# Patient Record
Sex: Female | Born: 2005 | Race: Black or African American | Hispanic: No | Marital: Single | Smoking: Never smoker
Health system: Southern US, Community
[De-identification: ages and names within clinical notes are randomized; demographics above are authoritative.]

## PROBLEM LIST (undated history)

## (undated) ENCOUNTER — Ambulatory Visit

## (undated) DIAGNOSIS — B338 Other specified viral diseases: Secondary | ICD-10-CM

## (undated) DIAGNOSIS — B974 Respiratory syncytial virus as the cause of diseases classified elsewhere: Secondary | ICD-10-CM

## (undated) DIAGNOSIS — E119 Type 2 diabetes mellitus without complications: Secondary | ICD-10-CM

## (undated) DIAGNOSIS — J45909 Unspecified asthma, uncomplicated: Secondary | ICD-10-CM

---
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
## 2023-01-28 ENCOUNTER — Ambulatory Visit
Admission: EM | Admit: 2023-01-28 | Discharge: 2023-01-28 | Disposition: A | Discharge status: Home / Self Care | Payer: Medicaid Other | Attending: Emergency Medicine | Admitting: Emergency Medicine

## 2023-01-28 ENCOUNTER — Encounter: Payer: Self-pay | Admitting: Emergency Medicine

## 2023-01-28 DIAGNOSIS — S80211A Abrasion, right knee, initial encounter: Secondary | ICD-10-CM

## 2023-01-28 DIAGNOSIS — S90811A Abrasion, right foot, initial encounter: Secondary | ICD-10-CM

## 2023-01-28 DIAGNOSIS — S80212A Abrasion, left knee, initial encounter: Secondary | ICD-10-CM

## 2023-01-28 DIAGNOSIS — S70211A Abrasion, right hip, initial encounter: Secondary | ICD-10-CM

## 2023-01-28 MED ORDER — MELOXICAM 7.5 MG PO TABS: 7.5000 mg | ORAL_TABLET | Freq: Every day | ORAL | 0 refills | Status: DC

## 2023-01-28 MED ORDER — MUPIROCIN CALCIUM 2 % EX CREA: 1.0000 | TOPICAL_CREAM | Freq: Two times a day (BID) | CUTANEOUS | 0 refills | Status: DC

## 2023-01-28 NOTE — ED Triage Notes (Signed)
Larey Seat today going down hill on concrete.  Multiple abrasion to right foot.  Large abrasion to right upper leg and abrasion to left knee.

## 2023-01-28 NOTE — Discharge Instructions (Signed)
You have been evaluated for your abrasions, areas have been cleaned by her nursing staff and dressings applied  Once home cleanse least once a day with diluted soap and water, use antibacterial soap such as Dial, pat and do not rub, apply topical antibiotic ointment and cover with a nonstick Band-Aid, may purchase large Band-Aids at most drug stores  Begin use of meloxicam once a day for at least 5 days then may use as needed, this medicine helps to reduce inflammation and will help manage her pain as you most likely will be sore, may take Tylenol 500 to 1000 mg every 6 hours in addition to this  Please watch for any signs of infection such as increased swelling, increased pain, new redness or puslike drainage if this occurs please return for evaluation

## 2023-01-28 NOTE — ED Provider Notes (Signed)
[facility]    CSN: [id number] Arrival date & time: 01/28/23  1809      History   Chief Complaint No chief complaint on file.   HPI Carla [name] is a 17 y.o. female.   Patient presents for evaluation of abrasions scattered across the bilateral lower extremities that occurred today after fall.  Patient was walking barefoot down a hill when she tripped and rolled down concrete.  Has not attempted treatment.  Able to bear weight and complete range of motion. denies numbness or tingling or swelling.   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) 02/12/2018   Type 2 diabetes mellitus with hyperglycemia, with long-term current use of insulin (HCC) 10/22/2016   Essential hypertension 10/22/2016   Inadequate parental supervision and control 09/23/2016   Noncompliance with diabetes treatment 09/23/2016   Maladaptive health behaviors affecting medical condition 07/16/2016   Hyperglycemia 05/04/2016   Abscess, gluteal cleft 05/21/2015   Folliculitis of perineum 05/21/2015   Acanthosis nigricans     Past Surgical History:  Procedure Laterality Date   INCISION AND DRAINAGE PERIRECTAL ABSCESS N/A 05/24/2015   Procedure: IRRIGATION AND DEBRIDEMENT PERIRECTAL ABSCESS PEDIATRIC;  Surgeon: Leonia Corona, MD;  Location: [facility] OR;  Service: Pediatrics;  Laterality: N/A;    OB History     Gravida  0    Para  0   Term  0   Preterm  0   AB  0   Living  0      SAB  0   IAB  0   Ectopic  0   Multiple  0   Live Births  0            Home Medications    Prior to Admission medications   Medication Sig Start Date End Date Taking? Authorizing Provider  meloxicam (MOBIC) 7.5 MG tablet Take 1 tablet (7.5 mg total) by mouth daily. 01/28/23  Yes Abagayle Klutts R, NP  mupirocin cream (BACTROBAN) 2 % Apply 1 Application topically 2 (two) times daily. 01/28/23  Yes Darshan Solanki R, NP  ACCU-CHEK FASTCLIX LANCETS MISC 1 each 4 (four) times daily - after meals and at bedtime by Does not apply route. Check sugar 6 x daily 07/08/17   Gretchen Short, NP  ACCU-CHEK GUIDE test strip USE ONE STRIP TO CHECK GLUCOSE SIX TIMES DAILY 02/08/18   Gretchen Short, NP  albuterol (PROVENTIL) (2.5 MG/3ML) 0.083% nebulizer solution Take 3 mLs (2.5 mg total) by nebulization every 6 (six) hours as needed for wheezing or shortness of breath. 08/01/22   Valentino Nose, NP  albuterol (VENTOLIN HFA) 108 (90 Base) MCG/ACT inhaler Inhale 1-2 puffs into the lungs every 6 (six) hours as needed for wheezing or shortness of breath. 08/01/22   Valentino Nose, NP  Blood Glucose Monitoring Suppl (ACCU-CHEK GUIDE) w/Device KIT 1 kit by Does not apply route daily as needed (use  to chcke Blood sugar 6x day). 12/02/18   Gretchen Short, NP  budesonide-formoterol (SYMBICORT) 160-4.5 MCG/ACT inhaler Inhale 2 puffs into the lungs 2 (two) times daily. Swish and spit after each use 11/04/22   Rodriguez-Southworth, Nettie Elm, PA-C  cetirizine (ZYRTEC ALLERGY) 10 MG tablet Take 1 tablet (10 mg total) by mouth daily. 06/27/21   Wallis Bamberg, PA-C  Dulaglutide (TRULICITY [place]) Inject into the skin.    [provider]  Norethindrone-Ethinyl Estradiol-Fe Biphas (LO LOESTRIN FE) 1 MG-10 MCG / 10 MCG tablet Take 1 tablet by mouth daily. Take 1 daily by mouth 09/04/22 12/03/22  Myna Hidalgo, DO    Family History Family History   Problem Relation Age of Onset   Diabetes Paternal Grandmother    Breast cancer Maternal Grandmother    Diabetes Maternal Grandfather     Social History Social History   Tobacco Use   Smoking status: Never   Smokeless tobacco: Never  Vaping Use   Vaping Use: Never used  Substance Use Topics   Alcohol use: Never   Drug use: Never     Allergies   Patient has no known allergies.   Review of Systems Review of Systems   Physical Exam Triage Vital Signs ED Triage Vitals  Enc Vitals Group     BP 01/28/23 1814 129/77     Pulse Rate 01/28/23 1814 (!) 102     Resp 01/28/23 1814 20     Temp 01/28/23 1814 98.7 F (37.1 C)     Temp Source 01/28/23 1814 Oral     SpO2 01/28/23 1814 97 %     Weight --      Height --      Head Circumference --      Peak Flow --      Pain Score 01/28/23 1815 5     Pain Loc --      Pain Edu? --      Excl. in GC? --    No data found.  Updated Vital Signs BP 129/77 (BP Location: Right Arm)   Pulse (!) 102   Temp 98.7 F (37.1 C) (Oral)   Resp 20   LMP 01/06/2023 (Exact Date)   SpO2 97%   Visual Acuity Right Eye Distance:   Left Eye Distance:   Bilateral Distance:    Right Eye Near:   Left Eye Near:    Bilateral Near:     Physical Exam Constitutional:      Appearance: Normal appearance.  Eyes:     Extraocular Movements: Extraocular movements intact.  Pulmonary:     Effort: Pulmonary effort is normal.  Skin:    Comments: 6 x 4 cm abrasion present to new lateral aspect of the right hip  4 x 3 cm abrasion present to the medial aspect of the right knee  1x1  cm abrasion present to anterior of the right knee  1.5 x 1.5 cm abrasion present to the anterior of the left knee  1 x 0.5 cm abrasion present to the dorsum aspect of the middle phalanx of the right fifth toe  1 x 0.5 cm cm abrasion present to the medial aspect of the proximal phalanx of the second toe without involvement of the nail  2 x 1 cm abrasion present to the  medial aspect of the right great toe  3 x 2.5 cm abrasion present to the medial aspect of the right ankle  1 x 1.5 cm abrasion present to the posterior aspect of the right  ankle    Neurological:     Mental Status: She is alert and oriented to person, place, and time. Mental status is at baseline.      UC Treatments / Results  Labs (all labs ordered are listed, but only abnormal results are displayed) Labs Reviewed - No data to display  EKG   Radiology No results found.  Procedures Procedures (including critical care time)  Medications Ordered in UC Medications - No data to display  Initial Impression / Assessment and Plan / UC Course  I have reviewed the triage vital signs and the nursing notes.  Pertinent labs & imaging results that were available during my care of the patient were reviewed by me and considered in my medical decision making (see chart for details).  Abrasions of the knee bilaterally, abrasion of the right foot, abrasion of the right hip  All areas have been cleaned by nursing staff with chlorhexidine and saline bacitracin applied and a nonadherent dressing, recommended daily cleansing with diluted antibacterial soap and water, application of bacitracin ointment and coverage with a nonadherent dressing until healed, prescribed meloxicam for management of pain, may take additional over-the-counter Tylenol as needed, given strict precautions for any signs of infection to return for reevaluation, may continue activity as tolerated Final Clinical Impressions(s) / UC Diagnoses   Final diagnoses:  Abrasion of knee, bilateral  Abrasion, foot, right, initial encounter  Abrasion, right hip, initial encounter     Discharge Instructions      You have been evaluated for your abrasions, areas have been cleaned by her nursing staff and dressings applied  Once home cleanse least once a day with diluted soap and water, use antibacterial soap such as Dial, pat and  do not rub, apply topical antibiotic ointment and cover with a nonstick Band-Aid, may purchase large Band-Aids at most drug stores  Begin use of meloxicam once a day for at least 5 days then may use as needed, this medicine helps to reduce inflammation and will help manage her pain as you most likely will be sore, may take Tylenol 500 to 1000 mg every 6 hours in addition to this  Please watch for any signs of infection such as increased swelling, increased pain, new redness or puslike drainage if this occurs please return for evaluation   ED Prescriptions     Medication Sig Dispense Auth. Provider   meloxicam (MOBIC) 7.5 MG tablet Take 1 tablet (7.5 mg total) by mouth daily. 30 tablet Neiman Roots R, NP   mupirocin cream (BACTROBAN) 2 % Apply 1 Application topically 2 (two) times daily. 15 g Valinda Hoar, NP      PDMP not reviewed this encounter.   Valinda Hoar, NP 01/28/23 1904

## 2023-02-10 ENCOUNTER — Ambulatory Visit
Admission: EM | Admit: 2023-02-10 | Discharge: 2023-02-10 | Disposition: A | Discharge status: Home / Self Care | Payer: Medicaid Other | Attending: Nurse Practitioner | Admitting: Nurse Practitioner

## 2023-02-10 DIAGNOSIS — L03115 Cellulitis of right lower limb: Secondary | ICD-10-CM

## 2023-02-10 MED ORDER — CEPHALEXIN 500 MG PO CAPS: 500.0000 mg | ORAL_CAPSULE | Freq: Four times a day (QID) | ORAL | 0 refills | Status: AC

## 2023-02-10 MED ORDER — MUPIROCIN CALCIUM 2 % EX CREA: 1.0000 | TOPICAL_CREAM | Freq: Two times a day (BID) | CUTANEOUS | 0 refills | Status: DC

## 2023-02-10 NOTE — ED Triage Notes (Signed)
Pt states she fell 2 weeks ago and has an abrasion on her right ankle that is not healing.  Seen here on the day it happened. Pt is diabetic. Open sore noted to right ankle, no drainage noted.

## 2023-02-10 NOTE — ED Provider Notes (Signed)
[facility]    CSN: [id number] Arrival date & time: 02/10/23  1322      History   Chief Complaint Chief Complaint  Patient presents with   Abrasion    HPI Carla [name] is a 17 y.o. female.   Patient presents today with concern for infection in abrasion to right medial ankle.  Reports that she was seen in urgent care approximately 2 weeks ago for multiple abrasions after a fall.  Reports all the other abrasions are healing well, however the one on her ankle is oozing pus and is painful to touch.  She denies redness, fever, nausea/vomiting.  She has been cleaning the wound and applying a thin layer of the mupirocin cream but has run out.  No numbness or tingling in the toes.  Patient does have a medical history significant for diabetes, high risk well-controlled; last A1c 2 months ago 5.9%.    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) 02/12/2018   Type 2 diabetes mellitus with hyperglycemia, with long-term current use of insulin (HCC) 10/22/2016   Essential hypertension 10/22/2016   Inadequate parental supervision and control 09/23/2016   Noncompliance with diabetes treatment 09/23/2016   Maladaptive health behaviors affecting medical condition 07/16/2016   Hyperglycemia 05/04/2016   Abscess, gluteal cleft 05/21/2015   Folliculitis of perineum 05/21/2015   Acanthosis nigricans      Past Surgical History:  Procedure Laterality Date   INCISION AND DRAINAGE PERIRECTAL ABSCESS N/A 05/24/2015   Procedure: IRRIGATION AND DEBRIDEMENT PERIRECTAL ABSCESS PEDIATRIC;  Surgeon: Leonia Corona, MD;  Location: [facility] OR;  Service: Pediatrics;  Laterality: N/A;    OB History     Gravida  0   Para  0   Term  0   Preterm  0   AB  0   Living  0      SAB  0   IAB  0   Ectopic  0   Multiple  0   Live Births  0            Home Medications    Prior to Admission medications   Medication Sig Start Date End Date Taking? Authorizing Provider  cephALEXin (KEFLEX) 500 MG capsule Take 1 capsule (500 mg total) by mouth 4 (four) times daily for 5 days. 02/10/23 02/15/23 Yes Valentino Nose, NP  ACCU-CHEK FASTCLIX LANCETS MISC 1 each 4 (four) times daily - after meals and at bedtime by Does not apply route. Check sugar 6 x daily 07/08/17   Gretchen Short, NP  ACCU-CHEK GUIDE test strip USE ONE STRIP TO CHECK GLUCOSE SIX TIMES DAILY 02/08/18   Gretchen Short, NP  albuterol (PROVENTIL) (2.5 MG/3ML) 0.083% nebulizer solution Take 3 mLs (2.5 mg total) by nebulization every 6 (six) hours as needed for wheezing or shortness of breath. 08/01/22   Valentino Nose, NP  albuterol (VENTOLIN HFA) 108 (90 Base) MCG/ACT  inhaler Inhale 1-2 puffs into the lungs every 6 (six) hours as needed for wheezing or shortness of breath. 08/01/22   Valentino Nose, NP  Blood Glucose Monitoring Suppl (ACCU-CHEK GUIDE) w/Device KIT 1 kit by Does not apply route daily as needed (use to chcke Blood sugar 6x day). 12/02/18   Gretchen Short, NP  budesonide-formoterol (SYMBICORT) 160-4.5 MCG/ACT inhaler Inhale 2 puffs into the lungs 2 (two) times daily. Swish and spit after each use 11/04/22   Rodriguez-Southworth, Nettie Elm, PA-C  cetirizine (ZYRTEC ALLERGY) 10 MG tablet Take 1 tablet (10 mg total) by mouth daily. 06/27/21   Wallis Bamberg, PA-C  Dulaglutide (TRULICITY [place]) Inject into the skin.     [provider]  meloxicam (MOBIC) 7.5 MG tablet Take 1 tablet (7.5 mg total) by mouth daily. 01/28/23   Valinda Hoar, NP  mupirocin cream (BACTROBAN) 2 % Apply 1 Application topically 2 (two) times daily. 02/10/23   Valentino Nose, NP  Norethindrone-Ethinyl Estradiol-Fe Biphas (LO LOESTRIN FE) 1 MG-10 MCG / 10 MCG tablet Take 1 tablet by mouth daily. Take 1 daily by mouth 09/04/22 12/03/22  Myna Hidalgo, DO    Family History Family History  Problem Relation Age of Onset   Diabetes Paternal Grandmother    Breast cancer Maternal Grandmother    Diabetes Maternal Grandfather     Social History Social History   Tobacco Use   Smoking status: Never   Smokeless tobacco: Never  Vaping Use   Vaping Use: Never used  Substance Use Topics   Alcohol use: Never   Drug use: Never     Allergies   Patient has no known allergies.   Review of Systems Review of Systems Per HPI  Physical Exam Triage Vital Signs ED Triage Vitals [02/10/23 1352]  Enc Vitals Group     BP 116/79     Pulse Rate 88     Resp 16     Temp 97.7 F (36.5 C)     Temp Source Oral     SpO2 99 %     Weight      Height      Head Circumference      Peak Flow      Pain Score 6     Pain Loc      Pain Edu?      Excl. in GC?    No data found.  Updated Vital Signs BP 116/79 (BP Location: Right Arm)   Pulse 88   Temp 97.7 F (36.5 C) (Oral)   Resp 16   LMP 02/01/2023 (Exact Date)   SpO2 99%   Visual Acuity Right Eye Distance:   Left Eye Distance:   Bilateral Distance:    Right Eye Near:   Left Eye Near:    Bilateral Near:     Physical Exam Vitals and nursing note reviewed.  Constitutional:      General: She is not in acute distress.    Appearance: Normal appearance. She is not toxic-appearing.  HENT:     Mouth/Throat:     Mouth: Mucous membranes are moist.     Pharynx: Oropharynx is clear.  Pulmonary:     Effort: Pulmonary effort is normal. No respiratory distress.  Skin:     General: Skin is warm and dry.     Capillary Refill: Capillary refill takes less than 2 seconds.     Findings: Abrasion present.     Comments: Circular wound to right medial ankle approximately 2 cm x  3 cm.  No fluctuance, warmth.  Minimal drainage with palpation of the wound.  Wound is tender to touch.  No surrounding erythema appreciated.  Neurological:     Mental Status: She is alert and oriented to person, place, and time.  Psychiatric:        Behavior: Behavior is cooperative.      UC Treatments / Results  Labs (all labs ordered are listed, but only abnormal results are displayed) Labs Reviewed - No data to display  EKG   Radiology No results found.  Procedures Procedures (including critical care time)  Medications Ordered in UC Medications - No data to display  Initial Impression / Assessment and Plan / UC Course  I have reviewed the triage vital signs and the nursing notes.  Pertinent labs & imaging results that were available during my care of the patient were reviewed by me and considered in my medical decision making (see chart for details).   Patient is well-appearing, normotensive, afebrile, not tachycardic, not tachypneic, oxygenating well on room air.    1. Cellulitis of right lower extremity Given history of diabetes, will cover for cellulitis with Keflex 4 times daily for 5 days Continue wound care mupirocin cream refill given Recommended follow-up with PCP for persistent/worsening symptoms despite treatment  The patient was given the opportunity to ask questions.  All questions answered to their satisfaction.  The patient is in agreement to this plan.    Final Clinical Impressions(s) / UC Diagnoses   Final diagnoses:  Cellulitis of right lower extremity     Discharge Instructions      Continue cleaning the skin twice daily with mild soap and water and pat dry Then apply a thin layer of the mupirocin cream twice daily  Start taking the Keflex and  take the entire course as prescribed  Follow up with PCP if symptoms persist or worsen despite treatment    ED Prescriptions     Medication Sig Dispense Auth. Provider   mupirocin cream (BACTROBAN) 2 % Apply 1 Application topically 2 (two) times daily. 15 g Cathlean Marseilles A, NP   cephALEXin (KEFLEX) 500 MG capsule Take 1 capsule (500 mg total) by mouth 4 (four) times daily for 5 days. 20 capsule Valentino Nose, NP      PDMP not reviewed this encounter.   Valentino Nose, NP 02/10/23 [phone]

## 2023-02-10 NOTE — Discharge Instructions (Addendum)
Continue cleaning the skin twice daily with mild soap and water and pat dry Then apply a thin layer of the mupirocin cream twice daily  Start taking the Keflex and take the entire course as prescribed  Follow up with PCP if symptoms persist or worsen despite treatment

## 2023-02-20 ENCOUNTER — Emergency Department (HOSPITAL_COMMUNITY)
Admission: EM | Admit: 2023-02-20 | Discharge: 2023-02-20 | Disposition: A | Discharge status: Home / Self Care | Payer: Medicaid Other | Attending: Emergency Medicine | Admitting: Emergency Medicine

## 2023-02-20 ENCOUNTER — Emergency Department (HOSPITAL_COMMUNITY): Payer: Medicaid Other

## 2023-02-20 ENCOUNTER — Other Ambulatory Visit: Payer: Self-pay

## 2023-02-20 ENCOUNTER — Encounter (HOSPITAL_COMMUNITY): Payer: Self-pay | Admitting: Emergency Medicine

## 2023-02-20 DIAGNOSIS — M25571 Pain in right ankle and joints of right foot: Secondary | ICD-10-CM | POA: Diagnosis present

## 2023-02-20 DIAGNOSIS — J45909 Unspecified asthma, uncomplicated: Secondary | ICD-10-CM | POA: Insufficient documentation

## 2023-02-20 DIAGNOSIS — E119 Type 2 diabetes mellitus without complications: Secondary | ICD-10-CM | POA: Insufficient documentation

## 2023-02-20 DIAGNOSIS — T1490XD Injury, unspecified, subsequent encounter: Secondary | ICD-10-CM

## 2023-02-20 MED ORDER — IBUPROFEN 800 MG PO TABS
800.0000 mg | ORAL_TABLET | Freq: Once | ORAL | Status: AC
  Administered 2023-02-20: 800 mg via ORAL
  Filled 2023-02-20: qty 1

## 2023-02-20 MED ORDER — OXYCODONE-ACETAMINOPHEN 5-325 MG PO TABS
1.0000 | ORAL_TABLET | Freq: Once | ORAL | Status: AC
  Administered 2023-02-20: 1 via ORAL
  Filled 2023-02-20: qty 1

## 2023-02-20 NOTE — Discharge Instructions (Signed)
You were seen today for ankle pain.  Your workup today is largely reassuring.  Your wound appears to be well-healing.  Your x-rays do not show any evidence of broken bones.  Take ibuprofen as needed for pain.  Ice as needed.  If you note warmth or redness of the ankle, fevers, or any new or worsening symptoms, you should be reevaluated.
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
## 2023-03-26 ENCOUNTER — Ambulatory Visit
Admission: EM | Admit: 2023-03-26 | Discharge: 2023-03-26 | Disposition: A | Discharge status: Home / Self Care | Payer: Medicaid Other | Attending: Family Medicine | Admitting: Family Medicine

## 2023-03-26 ENCOUNTER — Other Ambulatory Visit: Payer: Self-pay

## 2023-03-26 ENCOUNTER — Encounter: Payer: Self-pay | Admitting: Emergency Medicine

## 2023-03-26 ENCOUNTER — Ambulatory Visit (INDEPENDENT_AMBULATORY_CARE_PROVIDER_SITE_OTHER): Payer: Medicaid Other

## 2023-03-26 DIAGNOSIS — M79645 Pain in left finger(s): Secondary | ICD-10-CM

## 2023-03-26 DIAGNOSIS — S6992XA Unspecified injury of left wrist, hand and finger(s), initial encounter: Secondary | ICD-10-CM | POA: Diagnosis not present

## 2023-03-26 DIAGNOSIS — S61217A Laceration without foreign body of left little finger without damage to nail, initial encounter: Secondary | ICD-10-CM

## 2023-03-26 MED ORDER — MUPIROCIN 2 % EX OINT: 1.0000 | TOPICAL_OINTMENT | Freq: Two times a day (BID) | CUTANEOUS | 0 refills | Status: DC

## 2023-03-26 MED ORDER — CHLORHEXIDINE GLUCONATE 4 % EX SOLN: Freq: Every day | CUTANEOUS | 0 refills | Status: AC | PRN

## 2023-03-26 NOTE — Discharge Instructions (Signed)
Clean the area at least once a day with the Hibiclens solution and apply mupirocin ointment and a nonstick dressing.  Wear the finger splint for protection until feeling much better
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
## 2023-03-26 NOTE — ED Notes (Signed)
Site cleaned with hibiclense. Bacitracin applied, nonadherent pad, finger splint, secured with coban. Pt tolerated well.  Site management and infection prevention education reviewed. Pt verbalized understanding.

## 2023-05-06 ENCOUNTER — Emergency Department (HOSPITAL_COMMUNITY)
Admission: EM | Admit: 2023-05-06 | Discharge: 2023-05-06 | Disposition: A | Discharge status: Home / Self Care | Payer: Medicaid Other | Attending: Emergency Medicine | Admitting: Emergency Medicine

## 2023-05-06 ENCOUNTER — Other Ambulatory Visit: Payer: Self-pay

## 2023-05-06 ENCOUNTER — Emergency Department (HOSPITAL_COMMUNITY): Payer: Medicaid Other

## 2023-05-06 DIAGNOSIS — J45901 Unspecified asthma with (acute) exacerbation: Secondary | ICD-10-CM | POA: Diagnosis not present

## 2023-05-06 DIAGNOSIS — R0602 Shortness of breath: Secondary | ICD-10-CM | POA: Diagnosis present

## 2023-05-06 DIAGNOSIS — E119 Type 2 diabetes mellitus without complications: Secondary | ICD-10-CM | POA: Insufficient documentation

## 2023-05-06 MED ORDER — DEXAMETHASONE 4 MG PO TABS
10.0000 mg | ORAL_TABLET | Freq: Once | ORAL | Status: AC
  Administered 2023-05-06: 10 mg via ORAL
  Filled 2023-05-06: qty 3

## 2023-05-06 MED ORDER — IPRATROPIUM-ALBUTEROL 0.5-2.5 (3) MG/3ML IN SOLN
3.0000 mL | Freq: Once | RESPIRATORY_TRACT | Status: AC
  Administered 2023-05-06: 3 mL via RESPIRATORY_TRACT
  Filled 2023-05-06: qty 3

## 2023-05-06 NOTE — Discharge Instructions (Signed)
You were evaluated in the Emergency Department and after careful evaluation, we did not find any emergent condition requiring admission or further testing in the hospital.  Your exam/testing today is overall reassuring.  Symptoms likely due to a flare of your asthma.  Continue your home inhalers and follow-up with your regular doctor.  Please return to the Emergency Department if you experience any worsening of your condition.   Thank you for allowing [place] to be a part of your care.

## 2023-05-06 NOTE — ED Provider Notes (Signed)
AP-EMERGENCY DEPT [facility] Emergency Department Provider Note MRN:  [id number]  Arrival date & time: 05/06/23     Chief Complaint   Shortness of Breath   History of Present Illness   Carla [name] is a 17 y.o. year-old female with a history of diabetes, asthma presenting to the ED with chief complaint of shortness of breath.  Feeling short of breath and chest tightness, feels similar to prior flares of her asthma.  Started today at work.  Thinks maybe it is due to the change in the weather.  Denies leg pain or swelling.  Review of Systems  A thorough review of systems was obtained and all systems are negative except as noted in the HPI and PMH.   Patient's Health History    Past Medical History:  Diagnosis Date   Asthma    Diabetes (HCC)    Diabetes mellitus without complication (HCC)    RSV infection     Past Surgical History:  Procedure Laterality Date   INCISION AND DRAINAGE PERIRECTAL ABSCESS N/A 05/24/2015   Procedure: IRRIGATION AND DEBRIDEMENT PERIRECTAL ABSCESS PEDIATRIC;  Surgeon: Leonia Corona, MD;  Location: [facility] OR;  Service: Pediatrics;  Laterality: N/A;    Family History  Problem Relation Age of Onset   Diabetes Paternal Grandmother    Breast cancer Maternal Grandmother    Diabetes Maternal Grandfather     Social History   Socioeconomic History   Marital status: Single    Spouse name: Not on file   Number of children: Not on file   Years of education: Not on file   Highest education level: Not on file  Occupational History   Not on file  Tobacco Use   Smoking status: Never   Smokeless tobacco: Never  Vaping Use   Vaping status: Never Used  Substance and Sexual Activity   Alcohol use: Never   Drug use: Never   Sexual activity: Yes    Birth control/protection: Condom  Other Topics Concern   Not on file  Social History Narrative   She is currently in 11th grade at [employer]. She lives with mother and stepdad. She enjoys playing on  her phone and sing.    Social Determinants of Health   Financial Resource Strain: Low Risk  (09/04/2022)   Overall Financial Resource Strain (CARDIA)    Difficulty of Paying Living Expenses: Not very hard  Food Insecurity: Food Insecurity Present (09/04/2022)   Hunger Vital Sign    Worried About Running Out of Food in the Last Year: Sometimes true    Ran Out of Food in the Last Year: Sometimes true  Transportation Needs: No Transportation Needs (09/04/2022)   PRAPARE - Administrator, Civil Service (Medical): No    Lack of Transportation (Non-Medical): No  Physical Activity: Insufficiently Active (09/04/2022)   Exercise Vital Sign    Days of Exercise per Week: 2 days    Minutes of Exercise per Session: 30 min  Stress: Stress Concern Present (09/04/2022)   [employer] of Occupational Health - Occupational Stress Questionnaire    Feeling of Stress : Rather much  Social Connections: Moderately Integrated (09/04/2022)   Social Connection and Isolation Panel [NHANES]    Frequency of Communication with Friends and Family: Three times a week    Frequency of Social Gatherings with Friends and Family: Three times a week    Attends Religious Services: More than 4 times per year    Active Member of Clubs or Organizations: Yes  Attends Banker Meetings: More than 4 times per year    Marital Status: Never married  Intimate Partner Violence: Unknown (09/04/2022)   Humiliation, Afraid, Rape, and Kick questionnaire    Fear of Current or Ex-Partner: No    Emotionally Abused: No    Physically Abused: No    Sexually Abused: Patient declined     Physical Exam   Vitals:   05/06/23 0129 05/06/23 0209  BP:    Pulse: 99   Resp:    Temp:    SpO2: 93% 96%    CONSTITUTIONAL: Well-appearing, NAD NEURO/PSYCH:  Alert and oriented x 3, no focal deficits EYES:  eyes equal and reactive ENT/NECK:  no LAD, no JVD CARDIO: Regular rate, well-perfused, normal S1 and S2 PULM:  Diffuse wheezing GI/GU:  non-distended, non-tender MSK/SPINE:  No gross deformities, no edema SKIN:  no rash, atraumatic   *Additional and/or pertinent findings included in MDM below  Diagnostic and Interventional Summary    EKG Interpretation Date/Time:  Wednesday May 06 2023 01:56:18 EDT Ventricular Rate:  104 PR Interval:  129 QRS Duration:  75 QT Interval:  320 QTC Calculation: 421 R Axis:   68  Text Interpretation: Sinus tachycardia Confirmed by Kennis Carina [phone]) on 05/06/2023 2:36:54 AM       Labs Reviewed - No data to display  DG Chest Port 1 View  Final Result      Medications  dexamethasone (DECADRON) tablet 10 mg (has no administration in time range)  ipratropium-albuterol (DUONEB) 0.5-2.5 (3) MG/3ML nebulizer solution 3 mL (3 mLs Nebulization Given 05/06/23 0209)     Procedures  /  Critical Care Procedures  ED Course and Medical Decision Making  Initial Impression and Ddx Wheezing noted on exam, history of asthma, overall favoring asthma exacerbation.  Minimal tachycardia.  Overall highly doubt PE.  Will reassess after breathing treatment, obtain screening EKG, chest x-ray.  Past medical/surgical history that increases complexity of ED encounter: Asthma  Interpretation of Diagnostics I personally reviewed the EKG and my interpretation is as follows: Sinus rhythm  Chest x-ray without pneumothorax or lobar opacity  Patient Reassessment and Ultimate Disposition/Management     Lungs much improved and patient feeling better on reassessment after DuoNeb, appropriate for discharge.  Patient management required discussion with the following services or consulting groups:  None  Complexity of Problems Addressed Acute illness or injury that poses threat of life of bodily function  Additional Data Reviewed and Analyzed Further history obtained from: None  Additional Factors Impacting ED Encounter Risk None  Elmer Sow. Pilar Plate, MD [facility]  Emergency Medicine [facility] [email or web address]  Final Clinical Impressions(s) / ED Diagnoses     ICD-10-CM   1. Exacerbation of asthma, unspecified asthma severity, unspecified whether persistent  J45.901       ED Discharge Orders     None        Discharge Instructions Discussed with and Provided to Patient:    Discharge Instructions      You were evaluated in the Emergency Department and after careful evaluation, we did not find any emergent condition requiring admission or further testing in the hospital.  Your exam/testing today is overall reassuring.  Symptoms likely due to a flare of your asthma.  Continue your home inhalers and follow-up with your regular doctor.  Please return to the Emergency Department if you experience any worsening of your condition.   Thank you for allowing [place] to be a part of your care.  Sabas Sous, MD 05/06/23 [phone]

## 2023-05-06 NOTE — ED Triage Notes (Signed)
Pt c/o SOB, states she has asthma and began having an asthma attack at work. Has not been able to use her inhaler

## 2023-07-01 ENCOUNTER — Ambulatory Visit
Admission: EM | Admit: 2023-07-01 | Discharge: 2023-07-01 | Disposition: A | Discharge status: Home / Self Care | Payer: Medicaid Other | Attending: Family Medicine | Admitting: Family Medicine

## 2023-07-01 ENCOUNTER — Encounter: Payer: Self-pay | Admitting: Emergency Medicine

## 2023-07-01 DIAGNOSIS — J3089 Other allergic rhinitis: Secondary | ICD-10-CM

## 2023-07-01 DIAGNOSIS — J4541 Moderate persistent asthma with (acute) exacerbation: Secondary | ICD-10-CM

## 2023-07-01 MED ORDER — PREDNISONE 20 MG PO TABS: 40.0000 mg | ORAL_TABLET | Freq: Every day | ORAL | 0 refills | Status: DC

## 2023-07-01 MED ORDER — DEXAMETHASONE SODIUM PHOSPHATE 10 MG/ML IJ SOLN
10.0000 mg | Freq: Once | INTRAMUSCULAR | Status: AC
  Administered 2023-07-01: 10 mg via INTRAMUSCULAR

## 2023-07-01 MED ORDER — PROMETHAZINE-DM 6.25-15 MG/5ML PO SYRP: 5.0000 mL | ORAL_SOLUTION | Freq: Four times a day (QID) | ORAL | 0 refills | Status: DC | PRN

## 2023-07-01 NOTE — ED Triage Notes (Signed)
Feels wheezing in chest x 3 days.  States she had a productive cough.

## 2023-07-01 NOTE — ED Provider Notes (Signed)
[facility]    CSN: [id number] Arrival date & time: 07/01/23  1027      History   Chief Complaint No chief complaint on file.   HPI Carla [name] is a 17 y.o. female.   Presenting today with 3-day history of congestion, postnasal drainage, sore throat, wheezing, chest tightness, hacking cough.  Denies fever, chills, body aches, chest pain, abdominal pain, nausea vomiting or diarrhea.  History of persistent asthma on Symbicort, albuterol as needed.  Last use of albuterol was yesterday evening.  States compliant with allergy regimen.    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) 02/12/2018   Type 2 diabetes mellitus with hyperglycemia, with long-term current use of insulin (HCC) 10/22/2016   Essential hypertension 10/22/2016   Inadequate parental supervision and control 09/23/2016   Noncompliance with diabetes treatment 09/23/2016   Maladaptive health behaviors affecting medical condition 07/16/2016   Hyperglycemia 05/04/2016   Abscess, gluteal cleft 05/21/2015   Folliculitis of perineum 05/21/2015   Acanthosis nigricans     Past Surgical History:  Procedure Laterality Date   INCISION AND DRAINAGE PERIRECTAL ABSCESS N/A 05/24/2015   Procedure: IRRIGATION AND DEBRIDEMENT PERIRECTAL ABSCESS PEDIATRIC;  Surgeon: Leonia Corona, MD;  Location: [facility] OR;  Service: Pediatrics;  Laterality:  N/A;    OB History     Gravida  0   Para  0   Term  0   Preterm  0   AB  0   Living  0      SAB  0   IAB  0   Ectopic  0   Multiple  0   Live Births  0            Home Medications    Prior to Admission medications   Medication Sig Start Date End Date Taking? Authorizing Provider  predniSONE (DELTASONE) 20 MG tablet Take 2 tablets (40 mg total) by mouth daily with breakfast. 07/01/23  Yes Particia Nearing, PA-C  promethazine-dextromethorphan (PROMETHAZINE-DM) 6.25-15 MG/5ML syrup Take 5 mLs by mouth 4 (four) times daily as needed. 07/01/23  Yes Particia Nearing, PA-C  ACCU-CHEK FASTCLIX LANCETS MISC 1 each 4 (four) times daily - after meals and at bedtime by Does not apply route. Check sugar 6 x daily 07/08/17   Gretchen Short, NP  ACCU-CHEK GUIDE test strip USE ONE STRIP TO CHECK GLUCOSE SIX TIMES DAILY 02/08/18   Gretchen Short, NP  albuterol (PROVENTIL) (2.5 MG/3ML) 0.083% nebulizer solution Take 3 mLs (2.5 mg total) by nebulization every 6 (six) hours as needed for wheezing or shortness of breath. 08/01/22   Valentino Nose, NP  albuterol (VENTOLIN HFA) 108 (90 Base) MCG/ACT inhaler Inhale 1-2 puffs into the lungs every 6 (six) hours as needed for wheezing or shortness of breath. 08/01/22   Valentino Nose, NP  Blood Glucose Monitoring Suppl (ACCU-CHEK GUIDE) w/Device KIT 1 kit by Does  not apply route daily as needed (use to chcke Blood sugar 6x day). 12/02/18   Gretchen Short, NP  budesonide-formoterol (SYMBICORT) 160-4.5 MCG/ACT inhaler Inhale 2 puffs into the lungs 2 (two) times daily. Swish and spit after each use 11/04/22   Rodriguez-Southworth, Nettie Elm, PA-C  cetirizine (ZYRTEC ALLERGY) 10 MG tablet Take 1 tablet (10 mg total) by mouth daily. 06/27/21   Wallis Bamberg, PA-C  chlorhexidine (HIBICLENS) 4 % external liquid Apply topically daily as needed. 03/26/23   Particia Nearing, PA-C  Dulaglutide (TRULICITY [place]) Inject into the skin.     [provider]  Norethindrone-Ethinyl Estradiol-Fe Biphas (LO LOESTRIN FE) 1 MG-10 MCG / 10 MCG tablet Take 1 tablet by mouth daily. Take 1 daily by mouth 09/04/22 12/03/22  Myna Hidalgo, DO    Family History Family History  Problem Relation Age of Onset   Diabetes Paternal Grandmother    Breast cancer Maternal Grandmother    Diabetes Maternal Grandfather     Social History Social History   Tobacco Use   Smoking status: Never   Smokeless tobacco: Never  Vaping Use   Vaping status: Never Used  Substance Use Topics   Alcohol use: Never   Drug use: Never     Allergies   Patient has no known allergies.   Review of Systems Review of Systems PER HPI  Physical Exam Triage Vital Signs ED Triage Vitals  Encounter Vitals Group     BP 07/01/23 1048 126/84     Systolic BP Percentile --      Diastolic BP Percentile --      Pulse Rate 07/01/23 1048 90     Resp 07/01/23 1048 18     Temp 07/01/23 1048 98 F (36.7 C)     Temp Source 07/01/23 1048 Oral     SpO2 07/01/23 1048 98 %     Weight --      Height --      Head Circumference --      Peak Flow --      Pain Score 07/01/23 1049 5     Pain Loc --      Pain Education --      Exclude from Growth Chart --    No data found.  Updated Vital Signs BP 126/84 (BP Location: Right Arm)   Pulse 90   Temp 98 F (36.7 C) (Oral)   Resp 18   LMP 06/16/2023 (Exact Date)   SpO2 98%   Visual Acuity Right Eye Distance:   Left Eye Distance:   Bilateral Distance:    Right Eye Near:   Left Eye Near:    Bilateral Near:     Physical Exam Vitals and nursing note reviewed.  Constitutional:      Appearance: Normal appearance.  HENT:     Head: Atraumatic.     Right Ear: Tympanic membrane and external ear normal.     Left Ear: Tympanic membrane and external ear normal.     Nose: Congestion present.     Mouth/Throat:     Mouth: Mucous membranes are moist.     Pharynx: Posterior oropharyngeal erythema present.   Eyes:     Extraocular Movements: Extraocular movements intact.     Conjunctiva/sclera: Conjunctivae normal.  Cardiovascular:     Rate and Rhythm: Normal rate and regular rhythm.     Heart sounds: Normal heart sounds.  Pulmonary:     Effort: Pulmonary effort is normal.     Breath sounds: Wheezing present. No  rales.  Musculoskeletal:        General: Normal range of motion.     Cervical back: Normal range of motion and neck supple.  Skin:    General: Skin is warm and dry.  Neurological:     Mental Status: She is alert and oriented to person, place, and time.  Psychiatric:        Mood and Affect: Mood normal.        Thought Content: Thought content normal.      UC Treatments / Results  Labs (all labs ordered are listed, but only abnormal results are displayed) Labs Reviewed - No data to display  EKG   Radiology No results found.  Procedures Procedures (including critical care time)  Medications Ordered in UC Medications  dexamethasone (DECADRON) injection 10 mg (10 mg Intramuscular Given 07/01/23 1154)    Initial Impression / Assessment and Plan / UC Course  I have reviewed the triage vital signs and the nursing notes.  Pertinent labs & imaging results that were available during my care of the patient were reviewed by me and considered in my medical decision making (see chart for details).     Suspect seasonal allergy exacerbation causing asthma exacerbation. Vital signs within normal limits and she appears well and in no acute distress.  Treat with IM Decadron, Phenergan DM, allergy and asthma regimen and p.o. prednisone sent in case not resolving with the IM Decadron.  Final Clinical Impressions(s) / UC Diagnoses   Final diagnoses:  Seasonal allergic rhinitis due to other allergic trigger  Moderate persistent asthma with acute exacerbation   Discharge Instructions   None    ED Prescriptions     Medication Sig Dispense Auth. Provider   predniSONE  (DELTASONE) 20 MG tablet Take 2 tablets (40 mg total) by mouth daily with breakfast. 10 tablet Particia Nearing, PA-C   promethazine-dextromethorphan (PROMETHAZINE-DM) 6.25-15 MG/5ML syrup Take 5 mLs by mouth 4 (four) times daily as needed. 100 mL Particia Nearing, [place]      PDMP not reviewed this encounter.   Particia Nearing, [place] 07/01/23 1158

## 2023-08-15 ENCOUNTER — Ambulatory Visit
Admission: EM | Admit: 2023-08-15 | Discharge: 2023-08-15 | Disposition: A | Discharge status: Home / Self Care | Payer: Medicaid Other | Attending: Nurse Practitioner | Admitting: Nurse Practitioner

## 2023-08-15 ENCOUNTER — Ambulatory Visit (INDEPENDENT_AMBULATORY_CARE_PROVIDER_SITE_OTHER): Payer: Medicaid Other

## 2023-08-15 DIAGNOSIS — R112 Nausea with vomiting, unspecified: Secondary | ICD-10-CM

## 2023-08-15 DIAGNOSIS — Z8639 Personal history of other endocrine, nutritional and metabolic disease: Secondary | ICD-10-CM

## 2023-08-15 DIAGNOSIS — T887XXA Unspecified adverse effect of drug or medicament, initial encounter: Secondary | ICD-10-CM | POA: Diagnosis not present

## 2023-08-15 DIAGNOSIS — R109 Unspecified abdominal pain: Secondary | ICD-10-CM

## 2023-08-15 LAB — POCT URINALYSIS DIP (MANUAL ENTRY)
Bilirubin, UA: NEGATIVE
Blood, UA: NEGATIVE
Glucose, UA: NEGATIVE mg/dL
Ketones, POC UA: NEGATIVE mg/dL
Leukocytes, UA: NEGATIVE
Nitrite, UA: NEGATIVE
Protein Ur, POC: NEGATIVE mg/dL
Spec Grav, UA: 1.02 (ref 1.010–1.025)
Urobilinogen, UA: 1 U/dL
pH, UA: 8.5 — AB (ref 5.0–8.0)

## 2023-08-15 LAB — POCT URINE PREGNANCY: Preg Test, Ur: NEGATIVE

## 2023-08-15 LAB — POCT FASTING CBG KUC MANUAL ENTRY: POCT Glucose (KUC): 114 mg/dL — AB (ref 70–99)

## 2023-08-15 MED ORDER — LIDOCAINE VISCOUS HCL 2 % MT SOLN
15.0000 mL | Freq: Once | OROMUCOSAL | Status: AC
  Administered 2023-08-15: 15 mL via OROMUCOSAL

## 2023-08-15 MED ORDER — MYLANTA MAXIMUM STRENGTH 400-400-40 MG/5ML PO SUSP: 15.0000 mL | Freq: Four times a day (QID) | ORAL | 0 refills | Status: DC | PRN

## 2023-08-15 MED ORDER — FAMOTIDINE 20 MG PO TABS: 20.0000 mg | ORAL_TABLET | Freq: Every day | ORAL | 0 refills | Status: DC

## 2023-08-15 MED ORDER — ALUM & MAG HYDROXIDE-SIMETH 200-200-20 MG/5ML PO SUSP
30.0000 mL | Freq: Once | ORAL | Status: AC
  Administered 2023-08-15: 30 mL via ORAL

## 2023-08-15 MED ORDER — ONDANSETRON 4 MG PO TBDP: 4.0000 mg | ORAL_TABLET | Freq: Three times a day (TID) | ORAL | 0 refills | Status: AC | PRN

## 2023-08-15 NOTE — Discharge Instructions (Addendum)
X-ray of the abdomen is normal.  Urinalysis was also negative for possible infection. Take medication as prescribed. Increase fluids and allow for plenty of rest. May take over-the-counter Tylenol as needed for pain, fever, or general discomfort. Recommend a bland diet until nausea and vomiting improved.  This includes chicken noodle soup, broth, Jell-O, popsicles, Sprite or ginger ale. Continue to monitor your blood glucose levels closely.  If they exceed 450, it is recommended that you go to the emergency department for further evaluation. If symptoms continue to persist, there is concern that the Trulicity may be causing your symptoms.  I would speak to your endocrinologist/PCP if so. Go to the emergency department immediately for worsening abdominal pain, nausea or vomiting that you cannot control, or new symptoms of fever, chills, or other concerns. Follow-up as needed.

## 2023-08-15 NOTE — ED Triage Notes (Signed)
Pt reports she has been nauseas and having vomiting mid abdominal pain x 2 days.   States he sugars have been running 250s plus. States they are usually within normal range.

## 2023-08-15 NOTE — ED Provider Notes (Signed)
[facility]    CSN: [id number] Arrival date & time: 08/15/23  1358      History   Chief Complaint No chief complaint on file.   HPI Carla [name] is a 17 y.o. female.   The history is provided by the patient.   Patient presents for complaints of a 2-day history of abdominal pain with nausea and vomiting.  Patient states her pain is located around her bellybutton and right above her bellybutton, states that she becomes nauseated when she tries to eat something.  She rates her pain 5/10 at present.  Patient reports that her last bowel movement was today, but that she did strain when she went, and does not have a bowel movement daily.  She also reports that her blood sugars have been around 250, which is out of the norm for her.  She currently takes Trulicity as she is a type II diabetic.  States she was diagnosed at the age of 66.  Patient states that she has been experiencing "sour burps", along with bloating and gas.  Patient states that she missed 1 week of her Trulicity and did not have any symptoms.  Patient denies fever, chills, diarrhea, bloody stools, or urinary symptoms.  Reports that she did take 1 unit of NovoLog for her elevated blood sugars.  LMP: 08/10/2023.  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) 02/12/2018   Type 2 diabetes mellitus with hyperglycemia, with long-term current  use of insulin (HCC) 10/22/2016   Essential hypertension 10/22/2016   Inadequate parental supervision and control 09/23/2016   Noncompliance with diabetes treatment 09/23/2016   Maladaptive health behaviors affecting medical condition 07/16/2016   Hyperglycemia 05/04/2016   Abscess, gluteal cleft 05/21/2015   Folliculitis of perineum 05/21/2015   Acanthosis nigricans     Past Surgical History:  Procedure Laterality Date   INCISION AND DRAINAGE PERIRECTAL ABSCESS N/A 05/24/2015   Procedure: IRRIGATION AND DEBRIDEMENT PERIRECTAL ABSCESS PEDIATRIC;  Surgeon: Leonia Corona, MD;  Location: [facility] OR;  Service: Pediatrics;  Laterality: N/A;    OB History     Gravida  0   Para  0   Term  0   Preterm  0   AB  0   Living  0      SAB  0   IAB  0   Ectopic  0   Multiple  0   Live Births  0            Home Medications    Prior to Admission medications   Medication Sig Start Date End Date Taking? Authorizing Provider  alum & mag hydroxide-simeth (MYLANTA MAXIMUM STRENGTH) 400-400-40 MG/5ML suspension Take 15 mLs by mouth every 6 (six) hours as needed for indigestion. 08/15/23  Yes Leath-Warren, Sadie Haber, NP  famotidine (PEPCID) 20 MG tablet Take 1 tablet (20 mg total) by mouth daily. 08/15/23  Yes Leath-Warren, Sadie Haber,  NP  ondansetron (ZOFRAN-ODT) 4 MG disintegrating tablet Take 1 tablet (4 mg total) by mouth every 8 (eight) hours as needed. 08/15/23  Yes Leath-Warren, Sadie Haber, NP  ACCU-CHEK FASTCLIX LANCETS MISC 1 each 4 (four) times daily - after meals and at bedtime by Does not apply route. Check sugar 6 x daily 07/08/17   Gretchen Short, NP  ACCU-CHEK GUIDE test strip USE ONE STRIP TO CHECK GLUCOSE SIX TIMES DAILY 02/08/18   Gretchen Short, NP  albuterol (PROVENTIL) (2.5 MG/3ML) 0.083% nebulizer solution Take 3 mLs (2.5 mg total) by nebulization every 6 (six) hours as needed for wheezing or shortness of breath. 08/01/22   Valentino Nose, NP  albuterol  (VENTOLIN HFA) 108 (90 Base) MCG/ACT inhaler Inhale 1-2 puffs into the lungs every 6 (six) hours as needed for wheezing or shortness of breath. 08/01/22   Valentino Nose, NP  Blood Glucose Monitoring Suppl (ACCU-CHEK GUIDE) w/Device KIT 1 kit by Does not apply route daily as needed (use to chcke Blood sugar 6x day). 12/02/18   Gretchen Short, NP  budesonide-formoterol (SYMBICORT) 160-4.5 MCG/ACT inhaler Inhale 2 puffs into the lungs 2 (two) times daily. Swish and spit after each use 11/04/22   Rodriguez-Southworth, Nettie Elm, PA-C  cetirizine (ZYRTEC ALLERGY) 10 MG tablet Take 1 tablet (10 mg total) by mouth daily. 06/27/21   Wallis Bamberg, PA-C  chlorhexidine (HIBICLENS) 4 % external liquid Apply topically daily as needed. 03/26/23   Particia Nearing, PA-C  Dulaglutide (TRULICITY [place]) Inject into the skin.    [provider]  Norethindrone-Ethinyl Estradiol-Fe Biphas (LO LOESTRIN FE) 1 MG-10 MCG / 10 MCG tablet Take 1 tablet by mouth daily. Take 1 daily by mouth 09/04/22 12/03/22  Myna Hidalgo, DO  predniSONE (DELTASONE) 20 MG tablet Take 2 tablets (40 mg total) by mouth daily with breakfast. 07/01/23   Particia Nearing, PA-C  promethazine-dextromethorphan (PROMETHAZINE-DM) 6.25-15 MG/5ML syrup Take 5 mLs by mouth 4 (four) times daily as needed. 07/01/23   Particia Nearing, PA-C    Family History Family History  Problem Relation Age of Onset   Diabetes Paternal Grandmother    Breast cancer Maternal Grandmother    Diabetes Maternal Grandfather     Social History Social History   Tobacco Use   Smoking status: Never   Smokeless tobacco: Never  Vaping Use   Vaping status: Never Used  Substance Use Topics   Alcohol use: Never   Drug use: Never     Allergies   Patient has no known allergies.   Review of Systems Review of Systems Per HPI  Physical Exam Triage Vital Signs ED Triage Vitals  Encounter Vitals Group     BP 08/15/23 1402 131/82     Systolic BP  Percentile --      Diastolic BP Percentile --      Pulse Rate 08/15/23 1402 85     Resp 08/15/23 1402 16     Temp 08/15/23 1402 98 F (36.7 C)     Temp Source 08/15/23 1402 Oral     SpO2 08/15/23 1402 98 %     Weight --      Height --      Head Circumference --      Peak Flow --      Pain Score 08/15/23 1403 5     Pain Loc --      Pain Education --      Exclude from Growth Chart --    No data found.  Updated Vital Signs BP 131/82 (BP Location: Right Arm)   Pulse 85   Temp 98 F (36.7 C) (Oral)   Resp 16   LMP 08/10/2023 Comment: end date  SpO2 98%   Visual Acuity Right Eye Distance:   Left Eye Distance:   Bilateral Distance:    Right Eye Near:   Left Eye Near:    Bilateral Near:     Physical Exam Vitals and nursing note reviewed.  Constitutional:      General: She is not in acute distress.    Appearance: Normal appearance.  HENT:     Head: Normocephalic.  Eyes:     Extraocular Movements: Extraocular movements intact.     Conjunctiva/sclera: Conjunctivae normal.     Pupils: Pupils are equal, round, and reactive to light.  Cardiovascular:     Rate and Rhythm: Normal rate and regular rhythm.     Pulses: Normal pulses.     Heart sounds: Normal heart sounds.  Pulmonary:     Effort: Pulmonary effort is normal. No respiratory distress.     Breath sounds: Normal breath sounds. No stridor. No wheezing, rhonchi or rales.  Abdominal:     General: Bowel sounds are normal.     Palpations: Abdomen is soft.     Tenderness: There is abdominal tenderness in the epigastric area and periumbilical area.  Musculoskeletal:     Cervical back: Normal range of motion.  Lymphadenopathy:     Cervical: No cervical adenopathy.  Skin:    General: Skin is warm and dry.  Neurological:     General: No focal deficit present.     Mental Status: She is alert and oriented to person, place, and time.  Psychiatric:        Mood and Affect: Mood normal.        Behavior: Behavior normal.       UC Treatments / Results  Labs (all labs ordered are listed, but only abnormal results are displayed) Labs Reviewed  POCT URINALYSIS DIP (MANUAL ENTRY) - Abnormal; Notable for the following components:      Result Value   pH, UA 8.5 (*)    All other components within normal limits  POCT FASTING CBG [facility] MANUAL ENTRY - Abnormal; Notable for the following components:   POCT Glucose ([facility]) 114 (*)    All other components within normal limits  POCT URINE PREGNANCY    EKG   Radiology DG Abd 2 Views Result Date: 08/15/2023 CLINICAL DATA:  Abdominal pain for 2 days. EXAM: ABDOMEN - 2 VIEW COMPARISON:  Abdominal radiograph dated 09/15/2007. FINDINGS: The bowel gas pattern is normal. There is no evidence of free air. No radio-opaque calculi or other significant radiographic abnormality is seen. IMPRESSION: Negative. Electronically Signed   By: Romona Curls M.D.   On: 08/15/2023 15:04    Procedures Procedures (including critical care time)  Medications Ordered in UC Medications  alum & mag hydroxide-simeth (MAALOX/MYLANTA) 200-200-20 MG/5ML suspension 30 mL (30 mLs Oral Given 08/15/23 1455)  lidocaine (XYLOCAINE) 2 % viscous mouth solution 15 mL (15 mLs Mouth/Throat Given 08/15/23 1455)    Initial Impression / Assessment and Plan / UC Course  I have reviewed the triage vital signs and the nursing notes.  Pertinent labs & imaging results that were available during my care of the patient were reviewed by me and considered in my medical decision making (see chart for details).  X-ray of the abdomen was negative, urinalysis was also negative for faction.  Patient  did find good improvement of symptoms with GI cocktail.  Suspect patient symptoms may be caused by her Trulicity.  She reports that she did not have any symptoms after missing 1 dose of her Trulicity.  Will provide symptomatic treatment with ondansetron 4 mg for nausea, famotidine 20 mg for gas, bloating, and nausea, and  Mylanta for indigestion.  Supportive care recommendations were provided and discussed with the patient to include over-the-counter analgesics, dietary modifications to increase fruits and vegetables, a bland diet until nausea and vomiting improved, and to monitor blood glucose levels closely.  Patient was advised to follow-up with her endocrinologist to discuss current symptoms.  Patient was also given strict ER follow-up precautions.  Patient was in agreement with this plan of care and verbalized understanding.  All questions were answered.  Patient stable for discharge.  Work note was provided.  Final Clinical Impressions(s) / UC Diagnoses   Final diagnoses:  Abdominal pain, unspecified abdominal location  Nausea and vomiting, unspecified vomiting type  History of diabetes mellitus  Medication side effects     Discharge Instructions      X-ray of the abdomen is normal.  Urinalysis was also negative for possible infection. Take medication as prescribed. Increase fluids and allow for plenty of rest. May take over-the-counter Tylenol as needed for pain, fever, or general discomfort. Recommend a bland diet until nausea and vomiting improved.  This includes chicken noodle soup, broth, Jell-O, popsicles, Sprite or ginger ale. Continue to monitor your blood glucose levels closely.  If they exceed 450, it is recommended that you go to the emergency department for further evaluation. If symptoms continue to persist, there is concern that the Trulicity may be causing your symptoms.  I would speak to your endocrinologist/PCP if so. Go to the emergency department immediately for worsening abdominal pain, nausea or vomiting that you cannot control, or new symptoms of fever, chills, or other concerns. Follow-up as needed.     ED Prescriptions     Medication Sig Dispense Auth. Provider   ondansetron (ZOFRAN-ODT) 4 MG disintegrating tablet Take 1 tablet (4 mg total) by mouth every 8 (eight) hours as  needed. 20 tablet Leath-Warren, Sadie Haber, NP   alum & mag hydroxide-simeth (MYLANTA MAXIMUM STRENGTH) 400-400-40 MG/5ML suspension Take 15 mLs by mouth every 6 (six) hours as needed for indigestion. 355 mL Leath-Warren, Sadie Haber, NP   famotidine (PEPCID) 20 MG tablet Take 1 tablet (20 mg total) by mouth daily. 30 tablet Leath-Warren, Sadie Haber, NP      PDMP not reviewed this encounter.   Abran Cantor, NP 08/15/23 1520

## 2023-09-10 ENCOUNTER — Encounter: Payer: Medicaid Other | Admitting: Obstetrics and Gynecology

## 2023-10-08 ENCOUNTER — Ambulatory Visit
Admission: EM | Admit: 2023-10-08 | Discharge: 2023-10-08 | Disposition: A | Discharge status: Home / Self Care | Payer: Medicaid Other | Attending: Family Medicine | Admitting: Family Medicine

## 2023-10-08 DIAGNOSIS — R197 Diarrhea, unspecified: Secondary | ICD-10-CM | POA: Diagnosis not present

## 2023-10-08 DIAGNOSIS — R110 Nausea: Secondary | ICD-10-CM | POA: Diagnosis not present

## 2023-10-08 MED ORDER — ONDANSETRON 4 MG PO TBDP
4.0000 mg | ORAL_TABLET | Freq: Once | ORAL | Status: AC
  Administered 2023-10-08: 4 mg via ORAL

## 2023-10-08 MED ORDER — ONDANSETRON 4 MG PO TBDP: 4.0000 mg | ORAL_TABLET | Freq: Three times a day (TID) | ORAL | 0 refills | Status: AC | PRN

## 2023-10-08 NOTE — ED Provider Notes (Signed)
[facility]    CSN: [id number] Arrival date & time: 10/08/23  1123      History   Chief Complaint Chief Complaint  Patient presents with   Abdominal Pain    HPI Carla [name] is a 18 y.o. female.   Presenting today with 1 day history of nausea, diarrhea, lower abdominal pain, fatigue, chills, body aches.  Denies vomiting, fevers, cough, congestion, sore throat, chest pain, shortness of breath.  So far not trying anything over-the-counter for symptoms.  Tolerating p.o. fluids well.  Multiple sick contacts with GI virus at home.    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) 02/12/2018   Type 2 diabetes mellitus with hyperglycemia, with long-term current use of insulin (HCC) 10/22/2016   Essential hypertension 10/22/2016   Inadequate parental supervision and control 09/23/2016   Noncompliance with diabetes treatment 09/23/2016   Maladaptive health behaviors affecting medical condition 07/16/2016   Hyperglycemia 05/04/2016   Abscess, gluteal cleft 05/21/2015   Folliculitis of perineum 05/21/2015   Acanthosis nigricans     Past Surgical History:  Procedure Laterality Date   INCISION AND DRAINAGE PERIRECTAL ABSCESS N/A 05/24/2015   Procedure: IRRIGATION AND DEBRIDEMENT PERIRECTAL ABSCESS PEDIATRIC;  Surgeon: Leonia Corona, MD;  Location: [facility] OR;  Service: Pediatrics;  Laterality: N/A;     OB History     Gravida  0   Para  0   Term  0   Preterm  0   AB  0   Living  0      SAB  0   IAB  0   Ectopic  0   Multiple  0   Live Births  0            Home Medications    Prior to Admission medications   Medication Sig Start Date End Date Taking? Authorizing Provider  ondansetron (ZOFRAN-ODT) 4 MG disintegrating tablet Take 1 tablet (4 mg total) by mouth every 8 (eight) hours as needed for nausea or vomiting. 10/08/23  Yes Particia Nearing, PA-C  ACCU-CHEK FASTCLIX LANCETS MISC 1 each 4 (four) times daily - after meals and at bedtime by Does not apply route. Check sugar 6 x daily 07/08/17   Gretchen Short, NP  ACCU-CHEK GUIDE test strip USE ONE STRIP TO CHECK GLUCOSE SIX TIMES DAILY 02/08/18   Gretchen Short, NP  albuterol (PROVENTIL) (2.5 MG/3ML) 0.083% nebulizer solution Take 3 mLs (2.5 mg total) by nebulization every 6 (six) hours as needed for wheezing or shortness of breath. 08/01/22   Valentino Nose, NP  albuterol (VENTOLIN HFA) 108 (90 Base) MCG/ACT inhaler Inhale 1-2 puffs into the lungs every 6 (six) hours as needed for wheezing or shortness of breath. 08/01/22   Valentino Nose, NP  Blood Glucose Monitoring Suppl (ACCU-CHEK GUIDE) w/Device KIT 1 kit by Does not apply route daily as needed (use to chcke Blood sugar 6x  day). 12/02/18   Gretchen Short, NP  budesonide-formoterol [facility]) 160-4.5 MCG/ACT inhaler Inhale 2 puffs into the lungs 2 (two) times daily. Swish and spit after each use 11/04/22   Rodriguez-Southworth, Nettie Elm, PA-C  cetirizine (ZYRTEC ALLERGY) 10 MG tablet Take 1 tablet (10 mg total) by mouth daily. 06/27/21   Wallis Bamberg, PA-C  chlorhexidine (HIBICLENS) 4 % external liquid Apply topically daily as needed. 03/26/23   Particia Nearing, PA-C  Dulaglutide (TRULICITY [place]) Inject into the skin.    [provider]  Norethindrone-Ethinyl Estradiol-Fe Biphas (LO LOESTRIN FE) 1 MG-10 MCG / 10 MCG tablet Take 1  tablet by mouth daily. Take 1 daily by mouth 09/04/22 12/03/22  Myna Hidalgo, DO  ondansetron (ZOFRAN-ODT) 4 MG disintegrating tablet Take 1 tablet (4 mg total) by mouth every 8 (eight) hours as needed. 08/15/23   Leath-Warren, Sadie Haber, NP  promethazine-dextromethorphan (PROMETHAZINE-DM) 6.25-15 MG/5ML syrup Take 5 mLs by mouth 4 (four) times daily as needed. 07/01/23   Particia Nearing, PA-C    Family History Family History  Problem Relation Age of Onset   Diabetes Paternal Grandmother    Breast cancer Maternal Grandmother    Diabetes Maternal Grandfather     Social History Social History   Tobacco Use   Smoking status: Never   Smokeless tobacco: Never  Vaping Use   Vaping status: Never Used  Substance Use Topics   Alcohol use: Never   Drug use: Never     Allergies   Patient has no known allergies.   Review of Systems Review of Systems Per HPI  Physical Exam Triage Vital Signs ED Triage Vitals  Encounter Vitals Group     BP 10/08/23 1205 124/86     Systolic BP Percentile --      Diastolic BP Percentile --      Pulse Rate 10/08/23 1205 94     Resp 10/08/23 1205 16     Temp 10/08/23 1205 98.7 F (37.1 C)     Temp Source 10/08/23 1205 Oral     SpO2 10/08/23 1205 97 %     Weight --      Height --      Head Circumference --      Peak Flow --      Pain Score 10/08/23 1204 8     Pain Loc --      Pain Education --      Exclude from Growth Chart --    No data found.  Updated Vital Signs BP 124/86 (BP Location: Right Arm)   Pulse 94   Temp 98.7 F (37.1 C) (Oral)   Resp 16   LMP 09/28/2023 (Exact Date)   SpO2 97%   Visual Acuity Right Eye Distance:   Left Eye Distance:   Bilateral Distance:    Right Eye Near:   Left Eye Near:    Bilateral Near:     Physical Exam Vitals and nursing note reviewed.  Constitutional:      Appearance: Normal appearance. She is not ill-appearing.  HENT:     Head: Atraumatic.     Mouth/Throat:     Mouth:  Mucous membranes are moist.     Pharynx: Oropharynx is clear. No posterior oropharyngeal erythema.  Eyes:     Extraocular Movements: Extraocular movements intact.     Conjunctiva/sclera: Conjunctivae normal.  Cardiovascular:     Rate and Rhythm: Normal rate and regular rhythm.     Heart sounds: Normal heart sounds.  Pulmonary:  Effort: Pulmonary effort is normal.     Breath sounds: Normal breath sounds.  Abdominal:     General: Bowel sounds are normal. There is no distension.     Palpations: Abdomen is soft.     Tenderness: There is abdominal tenderness. There is no right CVA tenderness, left CVA tenderness or guarding.     Comments: Minimal generalized abdominal tenderness to palpation without distention or guarding  Musculoskeletal:        General: Normal range of motion.     Cervical back: Normal range of motion and neck supple.  Skin:    General: Skin is warm and dry.  Neurological:     Mental Status: She is alert and oriented to person, place, and time.  Psychiatric:        Mood and Affect: Mood normal.        Thought Content: Thought content normal.        Judgment: Judgment normal.      UC Treatments / Results  Labs (all labs ordered are listed, but only abnormal results are displayed) Labs Reviewed - No data to display  EKG   Radiology No results found.  Procedures Procedures (including critical care time)  Medications Ordered in UC Medications  ondansetron (ZOFRAN-ODT) disintegrating tablet 4 mg (4 mg Oral Given 10/08/23 1241)    Initial Impression / Assessment and Plan / UC Course  I have reviewed the triage vital signs and the nursing notes.  Pertinent labs & imaging results that were available during my care of the patient were reviewed by me and considered in my medical decision making (see chart for details).     Vital signs and exam very reassuring with no red flag findings today.  Suspect viral GI illness.  Treat with Zofran, brat diet,  fluids, and work note given.  Return for worsening symptoms.  Final Clinical Impressions(s) / UC Diagnoses   Final diagnoses:  Nausea without vomiting  Diarrhea, unspecified type   Discharge Instructions   None    ED Prescriptions     Medication Sig Dispense Auth. Provider   ondansetron (ZOFRAN-ODT) 4 MG disintegrating tablet Take 1 tablet (4 mg total) by mouth every 8 (eight) hours as needed for nausea or vomiting. 20 tablet Particia Nearing, [place]      PDMP not reviewed this encounter.   Particia Nearing, [place] 10/08/23 1243

## 2023-10-08 NOTE — ED Triage Notes (Signed)
Patient here today with c/o nausea, belly pain, and diarrhea since last night. Her family has also been having similar symptoms. No recent travel.

## 2023-10-29 ENCOUNTER — Emergency Department (HOSPITAL_COMMUNITY)
Admission: EM | Admit: 2023-10-29 | Discharge: 2023-10-30 | Disposition: A | Discharge status: Home / Self Care | Attending: Student | Admitting: Student

## 2023-10-29 ENCOUNTER — Other Ambulatory Visit: Payer: Self-pay

## 2023-10-29 DIAGNOSIS — R42 Dizziness and giddiness: Secondary | ICD-10-CM | POA: Diagnosis not present

## 2023-10-29 DIAGNOSIS — R1013 Epigastric pain: Secondary | ICD-10-CM | POA: Diagnosis present

## 2023-10-29 DIAGNOSIS — I10 Essential (primary) hypertension: Secondary | ICD-10-CM | POA: Insufficient documentation

## 2023-10-29 DIAGNOSIS — R197 Diarrhea, unspecified: Secondary | ICD-10-CM | POA: Insufficient documentation

## 2023-10-29 DIAGNOSIS — J45909 Unspecified asthma, uncomplicated: Secondary | ICD-10-CM | POA: Diagnosis not present

## 2023-10-29 DIAGNOSIS — E119 Type 2 diabetes mellitus without complications: Secondary | ICD-10-CM | POA: Insufficient documentation

## 2023-10-29 DIAGNOSIS — R112 Nausea with vomiting, unspecified: Secondary | ICD-10-CM | POA: Insufficient documentation

## 2023-10-29 NOTE — ED Triage Notes (Signed)
 Pt from home, complaining of abdominal pain and Nausea/vomiting for the past 2 days,  endorses diarrhea and dizziness. Denies SOB and CP. Pt AAOx4, Ambulatory.

## 2023-10-30 LAB — COMPREHENSIVE METABOLIC PANEL
ALT: 16 U/L (ref 0–44)
AST: 15 U/L (ref 15–41)
Albumin: 3.7 g/dL (ref 3.5–5.0)
Alkaline Phosphatase: 56 U/L (ref 38–126)
Anion gap: 10 (ref 5–15)
BUN: 13 mg/dL (ref 6–20)
CO2: 22 mmol/L (ref 22–32)
Calcium: 9.1 mg/dL (ref 8.9–10.3)
Chloride: 106 mmol/L (ref 98–111)
Creatinine, Ser: 0.87 mg/dL (ref 0.44–1.00)
GFR, Estimated: >60 mL/min (ref >60)
Glucose, Bld: 140 mg/dL — ABNORMAL HIGH (ref 70–99)
Potassium: 4 mmol/L (ref 3.5–5.1)
Sodium: 138 mmol/L (ref 135–145)
Total Bilirubin: 0.2 mg/dL (ref 0.0–1.2)
Total Protein: 7.4 g/dL (ref 6.5–8.1)

## 2023-10-30 LAB — CBC WITH DIFFERENTIAL/PLATELET
Abs Immature Granulocytes: 0.01 10*3/uL (ref 0.00–0.07)
Basophils Absolute: 0 10*3/uL (ref 0.0–0.1)
Basophils Relative: 0 %
Eosinophils Absolute: 0.3 10*3/uL (ref 0.0–0.5)
Eosinophils Relative: 3 %
HCT: 36.6 % (ref 36.0–46.0)
Hemoglobin: 11.5 g/dL — ABNORMAL LOW (ref 12.0–15.0)
Immature Granulocytes: 0 %
Lymphocytes Relative: 38 %
Lymphs Abs: 3.2 10*3/uL (ref 0.7–4.0)
MCH: 28 pg (ref 26.0–34.0)
MCHC: 31.4 g/dL (ref 30.0–36.0)
MCV: 89.3 fL (ref 80.0–100.0)
Monocytes Absolute: 0.5 10*3/uL (ref 0.1–1.0)
Monocytes Relative: 6 %
Neutro Abs: 4.4 10*3/uL (ref 1.7–7.7)
Neutrophils Relative %: 53 %
Platelets: 257 10*3/uL (ref 150–400)
RBC: 4.1 MIL/uL (ref 3.87–5.11)
RDW: 14.3 % (ref 11.5–15.5)
WBC: 8.5 10*3/uL (ref 4.0–10.5)
nRBC: 0 % (ref 0.0–0.2)

## 2023-10-30 LAB — RESP PANEL BY RT-PCR (RSV, FLU A&B, COVID)  RVPGX2
Influenza A by PCR: NEGATIVE
Influenza B by PCR: NEGATIVE
Resp Syncytial Virus by PCR: NEGATIVE
SARS Coronavirus 2 by RT PCR: NEGATIVE

## 2023-10-30 LAB — URINALYSIS, ROUTINE W REFLEX MICROSCOPIC
Bacteria, UA: NONE SEEN
Bilirubin Urine: NEGATIVE
Glucose, UA: NEGATIVE mg/dL
Ketones, ur: NEGATIVE mg/dL
Leukocytes,Ua: NEGATIVE
Nitrite: NEGATIVE
Protein, ur: 100 mg/dL — AB
RBC / HPF: >50 RBC/hpf (ref 0–5)
Specific Gravity, Urine: 1.027 (ref 1.005–1.030)
pH: 5 (ref 5.0–8.0)

## 2023-10-30 LAB — PREGNANCY, URINE: Preg Test, Ur: NEGATIVE

## 2023-10-30 LAB — LIPASE, BLOOD: Lipase: 33 U/L (ref 11–51)

## 2023-10-30 MED ORDER — DICYCLOMINE HCL 10 MG/ML IM SOLN
20.0000 mg | Freq: Once | INTRAMUSCULAR | Status: AC
  Administered 2023-10-30: 20 mg via INTRAMUSCULAR
  Filled 2023-10-30: qty 2

## 2023-10-30 MED ORDER — DICYCLOMINE HCL 20 MG PO TABS: 20.0000 mg | ORAL_TABLET | Freq: Two times a day (BID) | ORAL | 0 refills | Status: AC

## 2023-10-30 NOTE — ED Provider Notes (Signed)
 [place] EMERGENCY DEPARTMENT AT [facility] Provider Note  CSN: [id number] Arrival date & time: 10/29/23 2236  Chief Complaint(s) Abdominal Pain and Emesis  HPI Carla [name] is a 18 y.o. female with PMH asthma, T2DM, obesity who presents emergency room for evaluation of abdominal pain vomiting.  States that symptoms been worsening over the last 2 days.  Endorses associated diarrhea.  Pain primarily in the epigastrium with vomiting.  Denies chest pain, shortness of breath, headache, fever or other systemic symptoms.   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 Authorizing Provider  ACCU-CHEK FASTCLIX LANCETS MISC 1 each 4 (four) times daily - after meals and at bedtime by Does not apply route. Check sugar 6 x daily 07/08/17    Gretchen Short, NP  ACCU-CHEK GUIDE test strip USE ONE STRIP TO CHECK GLUCOSE SIX TIMES DAILY 02/08/18   Gretchen Short, NP  albuterol (PROVENTIL) (2.5 MG/3ML) 0.083% nebulizer solution Take 3 mLs (2.5 mg total) by nebulization every 6 (six) hours as needed for wheezing or shortness of breath. 08/01/22   Valentino Nose, NP  albuterol (VENTOLIN HFA) 108 (90 Base) MCG/ACT inhaler Inhale 1-2 puffs into the lungs every 6 (six) hours as needed for wheezing or shortness of breath. 08/01/22   Valentino Nose, NP  Blood Glucose Monitoring Suppl (ACCU-CHEK GUIDE) w/Device KIT 1 kit by Does not apply route daily as needed (use to chcke Blood sugar 6x day). 12/02/18   Gretchen Short, NP  budesonide-formoterol (SYMBICORT) 160-4.5 MCG/ACT inhaler Inhale 2 puffs into the lungs 2 (two) times daily. Swish and spit after each use 11/04/22   Rodriguez-Southworth, Nettie Elm, PA-C  cetirizine (ZYRTEC ALLERGY) 10 MG tablet Take 1 tablet (10 mg total) by mouth daily. 06/27/21   Wallis Bamberg, PA-C  chlorhexidine (HIBICLENS) 4 % external liquid Apply topically daily as needed. 03/26/23   Particia Nearing, PA-C  Dulaglutide (TRULICITY [place]) Inject into the skin.    [provider]  Norethindrone-Ethinyl Estradiol-Fe Biphas (LO LOESTRIN FE) 1 MG-10 MCG / 10 MCG tablet Take 1 tablet by mouth daily. Take 1 daily by mouth 09/04/22 12/03/22  Myna Hidalgo, DO  ondansetron (ZOFRAN-ODT) 4 MG disintegrating tablet Take 1 tablet (4 mg total) by mouth every 8 (eight) hours as needed. 08/15/23   Leath-Warren, Sadie Haber, NP  ondansetron (ZOFRAN-ODT) 4 MG disintegrating tablet Take 1 tablet (4 mg total) by mouth every 8 (  eight) hours as needed for nausea or vomiting. 10/08/23   Particia Nearing, PA-C  promethazine-dextromethorphan (PROMETHAZINE-DM) 6.25-15 MG/5ML syrup Take 5 mLs by mouth 4 (four) times daily as needed. 07/01/23   Particia Nearing, PA-C                                                                                                                                     Past Surgical History Past Surgical History:  Procedure Laterality Date   INCISION AND DRAINAGE PERIRECTAL ABSCESS N/A 05/24/2015   Procedure: IRRIGATION AND DEBRIDEMENT PERIRECTAL ABSCESS PEDIATRIC;  Surgeon: Leonia Corona, MD;  Location: [facility] OR;  Service: Pediatrics;  Laterality: N/A;   Family History Family History  Problem Relation Age of Onset   Diabetes Paternal Grandmother    Breast cancer Maternal Grandmother    Diabetes Maternal Grandfather     Social History Social History   Tobacco Use   Smoking status: Never   Smokeless tobacco: Never  Vaping Use   Vaping status: Never Used  Substance Use Topics   Alcohol use: Never   Drug use: Never   Allergies Patient has no known allergies.  Review of Systems Review of Systems  Gastrointestinal:  Positive for abdominal pain, nausea and vomiting.    Physical Exam Vital Signs  I have reviewed the triage vital signs BP 120/69 (BP Location: Right Arm)   Pulse 77   Temp 98.4 F (36.9 C) (Oral)   Resp 16   Ht 5\' 5"  (1.651 m)   Wt 107 kg   LMP 09/28/2023 (Exact Date)   SpO2 100%   BMI 39.25 kg/m   Physical Exam Vitals and nursing note reviewed.  Constitutional:      General: She is not in acute distress.    Appearance: She is well-developed.  HENT:     Head: Normocephalic and atraumatic.  Eyes:     Conjunctiva/sclera: Conjunctivae normal.  Cardiovascular:     Rate and Rhythm: Normal rate and regular rhythm.     Heart sounds: No murmur heard. Pulmonary:     Effort: Pulmonary effort is normal. No respiratory distress.     Breath sounds: Normal breath sounds.  Abdominal:     Palpations: Abdomen is soft.     Tenderness: There is abdominal tenderness in the epigastric area.  Musculoskeletal:        General: No swelling.     Cervical back: Neck supple.  Skin:    General: Skin is warm and dry.     Capillary Refill: Capillary refill takes less  than 2 seconds.  Neurological:     Mental Status: She is alert.  Psychiatric:        Mood and Affect: Mood normal.     ED Results and Treatments Labs (all labs ordered are listed, but only abnormal results are displayed) Labs Reviewed  CBC WITH DIFFERENTIAL/PLATELET - Abnormal; Notable for the following components:  Result Value   Hemoglobin 11.5 (*)    All other components within normal limits  COMPREHENSIVE METABOLIC PANEL - Abnormal; Notable for the following components:   Glucose, Bld 140 (*)    All other components within normal limits  RESP PANEL BY RT-PCR (RSV, FLU A&B, COVID)  RVPGX2  LIPASE, BLOOD  URINALYSIS, ROUTINE W REFLEX MICROSCOPIC                                                                                                                          Radiology No results found.  Pertinent labs & imaging results that were available during my care of the patient were reviewed by me and considered in my medical decision making (see MDM for details).  Medications Ordered in ED Medications  dicyclomine (BENTYL) injection 20 mg (20 mg Intramuscular Given 10/30/23 0303)                                                                                                                                     Procedures Procedures  (including critical care time)  Medical Decision Making / ED Course   This patient presents to the ED for concern of abdominal pain, this involves an extensive number of treatment options, and is a complaint that carries with it a high risk of complications and morbidity.  The differential diagnosis includes GERD/gastritis, peptic ulcer disease, pancreatitis, gastroparesis, pneumonia, pleurisy, pericarditis  MDM: Patient seen emergency room for evaluation of abdominal pain.  Physical exam with some mild tenderness in the epigastrium but is otherwise unremarkable.  Laboratory evaluation with a hemoglobin of 11.5 but is otherwise unremarkable.,   COVID, flu, RSV negative and obtained in the setting of nausea and vomiting.  Patient given Bentyl and on reevaluation her symptoms have resolved.  She is able to tolerate p.o. without difficulty.  Suspect viral gastroenteritis.  With negative laboratory workup, stable vital signs and reassuring exam, will defer CT imaging at this time.  At this time she does not meet inpatient criteria for admission and will be discharged with outpatient follow-up.   Additional history obtained: -Additional history obtained from partner -External records from outside source obtained and reviewed including: Chart review including previous notes, labs, imaging, consultation notes   Lab Tests: -I ordered, reviewed, and interpreted labs.   The pertinent results include:   Labs Reviewed  CBC WITH DIFFERENTIAL/PLATELET - Abnormal; Notable for  the following components:      Result Value   Hemoglobin 11.5 (*)    All other components within normal limits  COMPREHENSIVE METABOLIC PANEL - Abnormal; Notable for the following components:   Glucose, Bld 140 (*)    All other components within normal limits  RESP PANEL BY RT-PCR (RSV, FLU A&B, COVID)  RVPGX2  LIPASE, BLOOD  URINALYSIS, ROUTINE W REFLEX MICROSCOPIC      Medicines ordered and prescription drug management: Meds ordered this encounter  Medications   dicyclomine (BENTYL) injection 20 mg    -I have reviewed the patients home medicines and have made adjustments as needed  Critical interventions none    Social Determinants of Health:  Factors impacting patients care include: none   Reevaluation: After the interventions noted above, I reevaluated the patient and found that they have :improved  Co morbidities that complicate the patient evaluation  Past Medical History:  Diagnosis Date   Asthma    Diabetes (HCC)    Diabetes mellitus without complication (HCC)    RSV infection       Dispostion: I considered admission for this patient,  but at this time she does not meet inpatient criteria for admission and will be discharged with outpatient follow-up     Final Clinical Impression(s) / ED Diagnoses Final diagnoses:  None     @PCDICTATION @    Avilene Marrin, Wyn Forster, MD 10/30/23 [phone]

## 2023-11-02 ENCOUNTER — Ambulatory Visit
Admission: EM | Admit: 2023-11-02 | Discharge: 2023-11-02 | Disposition: A | Discharge status: Home / Self Care | Attending: Nurse Practitioner | Admitting: Nurse Practitioner

## 2023-11-02 ENCOUNTER — Encounter: Payer: Self-pay | Admitting: *Deleted

## 2023-11-02 DIAGNOSIS — N898 Other specified noninflammatory disorders of vagina: Secondary | ICD-10-CM | POA: Diagnosis not present

## 2023-11-02 MED ORDER — METRONIDAZOLE 0.75 % VA GEL: 1.0000 | Freq: Every day | VAGINAL | 0 refills | Status: AC

## 2023-11-02 NOTE — Discharge Instructions (Addendum)
 We will contact you if the vaginal testing from today comes back abnormal.  In the meantime, recommend metronidazole gel nightly for 7 nights to treat for BV.  As we discussed today, avoid using any fragrances, soaps, or perfumed products in your vaginal area.  The only thing you need to clean with down there is water.  Recommend close follow-up with OB/GYN if testing from today is negative and symptoms do not improve.

## 2023-11-02 NOTE — ED Triage Notes (Signed)
 Pt states she has a strong vaginal odor X 3 months she is using famine washes and boric acid without relief. She also complains of vaginal discharge.

## 2023-11-02 NOTE — ED Provider Notes (Signed)
 [facility]    CSN: [id number] Arrival date & time: 11/02/23  1159      History   Chief Complaint Chief Complaint  Patient presents with   Vaginal Discharge    HPI Carla [name] is a 18 y.o. female.   Patient presents today with 6-month history of vaginal discharge that she describes as thin, clear to white, and has a stronger smell of bleach/fish than her normal vaginal smell.  She denies abdominal pain, fever, nausea/vomiting.  No dysuria, urinary frequency or urgency, vaginal rashes, sores, or lesions.  She has tried using feminine washes, boric acid, and other intra vaginal medicine without relief.  She is sexually active but is not concerned for STI today, however is willing to have testing.  She declines HIV and syphilis testing.    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) 02/12/2018   Type 2 diabetes mellitus with hyperglycemia, with long-term current use of insulin (HCC) 10/22/2016   Essential hypertension 10/22/2016   Inadequate parental supervision and control 09/23/2016   Noncompliance with diabetes treatment 09/23/2016   Maladaptive health behaviors affecting medical condition 07/16/2016   Hyperglycemia 05/04/2016   Abscess, gluteal cleft 05/21/2015   Folliculitis of perineum 05/21/2015   Acanthosis nigricans     Past Surgical History:  Procedure Laterality Date    INCISION AND DRAINAGE PERIRECTAL ABSCESS N/A 05/24/2015   Procedure: IRRIGATION AND DEBRIDEMENT PERIRECTAL ABSCESS PEDIATRIC;  Surgeon: Leonia Corona, MD;  Location: [facility] OR;  Service: Pediatrics;  Laterality: N/A;    OB History     Gravida  0   Para  0   Term  0   Preterm  0   AB  0   Living  0      SAB  0   IAB  0   Ectopic  0   Multiple  0   Live Births  0            Home Medications    Prior to Admission medications   Medication Sig Start Date End Date Taking? Authorizing Provider  budesonide-formoterol (SYMBICORT) 160-4.5 MCG/ACT inhaler Inhale 2 puffs into the lungs 2 (two) times daily. Swish and spit after each use 11/04/22  Yes Rodriguez-Southworth, Nettie Elm, PA-C  cetirizine (ZYRTEC ALLERGY) 10 MG tablet Take 1 tablet (10 mg total) by mouth daily. 06/27/21  Yes Wallis Bamberg, PA-C  Dulaglutide (TRULICITY ) Inject into the skin.   Yes [provider]  metroNIDAZOLE (METROGEL) 0.75 % vaginal gel Place 1 Applicatorful vaginally at bedtime for 7 days. 11/02/23 11/09/23 Yes Valentino Nose, NP  Norethindrone-Ethinyl Estradiol-Fe Biphas (LO LOESTRIN FE) 1 MG-10 MCG / 10 MCG tablet Take 1 tablet by mouth daily. Take 1 daily by mouth 09/04/22 11/02/23 Yes Ozan, Victorino Dike, DO  ACCU-CHEK FASTCLIX LANCETS MISC 1 each 4 (four) times daily - after meals and at bedtime by Does not apply route. Check sugar  6 x daily 07/08/17   Gretchen Short, NP  ACCU-CHEK GUIDE test strip USE ONE STRIP TO CHECK GLUCOSE SIX TIMES DAILY 02/08/18   Gretchen Short, NP  albuterol (PROVENTIL) (2.5 MG/3ML) 0.083% nebulizer solution Take 3 mLs (2.5 mg total) by nebulization every 6 (six) hours as needed for wheezing or shortness of breath. 08/01/22   Valentino Nose, NP  albuterol (VENTOLIN HFA) 108 (90 Base) MCG/ACT inhaler Inhale 1-2 puffs into the lungs every 6 (six) hours as needed for wheezing or shortness of breath. 08/01/22   Valentino Nose, NP  Blood Glucose Monitoring Suppl  (ACCU-CHEK GUIDE) w/Device KIT 1 kit by Does not apply route daily as needed (use to chcke Blood sugar 6x day). 12/02/18   Gretchen Short, NP  chlorhexidine (HIBICLENS) 4 % external liquid Apply topically daily as needed. 03/26/23   Particia Nearing, PA-C  dicyclomine (BENTYL) 20 MG tablet Take 1 tablet (20 mg total) by mouth 2 (two) times daily. 10/30/23   Kommor, [place], MD  ondansetron (ZOFRAN-ODT) 4 MG disintegrating tablet Take 1 tablet (4 mg total) by mouth every 8 (eight) hours as needed. 08/15/23   Leath-Warren, Sadie Haber, NP  ondansetron (ZOFRAN-ODT) 4 MG disintegrating tablet Take 1 tablet (4 mg total) by mouth every 8 (eight) hours as needed for nausea or vomiting. 10/08/23   Particia Nearing, PA-C  promethazine-dextromethorphan (PROMETHAZINE-DM) 6.25-15 MG/5ML syrup Take 5 mLs by mouth 4 (four) times daily as needed. 07/01/23   Particia Nearing, PA-C    Family History Family History  Problem Relation Age of Onset   Diabetes Paternal Grandmother    Breast cancer Maternal Grandmother    Diabetes Maternal Grandfather     Social History Social History   Tobacco Use   Smoking status: Never   Smokeless tobacco: Never  Vaping Use   Vaping status: Never Used  Substance Use Topics   Alcohol use: Never   Drug use: Never     Allergies   Patient has no known allergies.   Review of Systems Review of Systems Per HPI  Physical Exam Triage Vital Signs ED Triage Vitals  Encounter Vitals Group     BP 11/02/23 1345 128/77     Systolic BP Percentile --      Diastolic BP Percentile --      Pulse Rate 11/02/23 1345 82     Resp 11/02/23 1345 18     Temp 11/02/23 1345 98 F (36.7 C)     Temp Source 11/02/23 1345 Oral     SpO2 11/02/23 1345 99 %     Weight --      Height --      Head Circumference --      Peak Flow --      Pain Score 11/02/23 1344 0     Pain Loc --      Pain Education --      Exclude from Growth Chart --    No data found.  Updated Vital  Signs BP 128/77 (BP Location: Right Arm)   Pulse 82   Temp 98 F (36.7 C) (Oral)   Resp 18   LMP 10/28/2023 (Exact Date)   SpO2 99%   Visual Acuity Right Eye Distance:   Left Eye Distance:   Bilateral Distance:    Right Eye Near:   Left Eye Near:    Bilateral Near:     Physical Exam Vitals and nursing note reviewed.  Constitutional:      General: She  is not in acute distress.    Appearance: Normal appearance. She is not toxic-appearing.  HENT:     Mouth/Throat:     Mouth: Mucous membranes are moist.     Pharynx: Oropharynx is clear.  Pulmonary:     Effort: Pulmonary effort is normal. No respiratory distress.  Genitourinary:    Comments: Deferred-self swab performed by patient Skin:    General: Skin is warm and dry.     Coloration: Skin is not jaundiced or pale.     Findings: No erythema.  Neurological:     Mental Status: She is alert and oriented to person, place, and time.     Motor: No weakness.     Gait: Gait normal.  Psychiatric:        Mood and Affect: Mood normal.        Behavior: Behavior is cooperative.      UC Treatments / Results  Labs (all labs ordered are listed, but only abnormal results are displayed) Labs Reviewed  CERVICOVAGINAL ANCILLARY ONLY    EKG   Radiology No results found.  Procedures Procedures (including critical care time)  Medications Ordered in UC Medications - No data to display  Initial Impression / Assessment and Plan / UC Course  I have reviewed the triage vital signs and the nursing notes.  Pertinent labs & imaging results that were available during my care of the patient were reviewed by me and considered in my medical decision making (see chart for details).   Patient is well-appearing, normotensive, afebrile, not tachycardic, not tachypneic, oxygenating well on room air.    1. Vaginal discharge Symptoms are consistent with BV, will treat with MetroGel nightly for 7 nights Vaginal cytology is pending-treat as  indicated if anything other than BV Safe sex practices discussed Patient declines HIV and syphilis testing today  The patient was given the opportunity to ask questions.  All questions answered to their satisfaction.  The patient is in agreement to this plan.    Final Clinical Impressions(s) / UC Diagnoses   Final diagnoses:  Vaginal discharge     Discharge Instructions      We will contact you if the vaginal testing from today comes back abnormal.  In the meantime, recommend metronidazole gel nightly for 7 nights to treat for BV.  As we discussed today, avoid using any fragrances, soaps, or perfumed products in your vaginal area.  The only thing you need to clean with down there is water.  Recommend close follow-up with OB/GYN if testing from today is negative and symptoms do not improve.   ED Prescriptions     Medication Sig Dispense Auth. Provider   metroNIDAZOLE (METROGEL) 0.75 % vaginal gel Place 1 Applicatorful vaginally at bedtime for 7 days. 70 g Valentino Nose, NP      PDMP not reviewed this encounter.   Valentino Nose, NP 11/02/23 [phone]

## 2023-11-03 LAB — CERVICOVAGINAL ANCILLARY ONLY
Bacterial Vaginitis (gardnerella): POSITIVE — AB
Candida Glabrata: NEGATIVE
Candida Vaginitis: NEGATIVE
Chlamydia: NEGATIVE
Comment: NEGATIVE
Comment: NEGATIVE
Comment: NEGATIVE
Comment: NEGATIVE
Comment: NEGATIVE
Comment: NORMAL
Neisseria Gonorrhea: NEGATIVE
Trichomonas: NEGATIVE

## 2023-12-04 ENCOUNTER — Emergency Department (HOSPITAL_COMMUNITY)
Admission: EM | Admit: 2023-12-04 | Discharge: 2023-12-04 | Disposition: A | Discharge status: Home / Self Care | Attending: Emergency Medicine | Admitting: Emergency Medicine

## 2023-12-04 ENCOUNTER — Encounter (HOSPITAL_COMMUNITY): Payer: Self-pay

## 2023-12-04 ENCOUNTER — Other Ambulatory Visit: Payer: Self-pay

## 2023-12-04 DIAGNOSIS — Z23 Encounter for immunization: Secondary | ICD-10-CM | POA: Insufficient documentation

## 2023-12-04 DIAGNOSIS — S6991XA Unspecified injury of right wrist, hand and finger(s), initial encounter: Secondary | ICD-10-CM | POA: Diagnosis present

## 2023-12-04 DIAGNOSIS — S61214A Laceration without foreign body of right ring finger without damage to nail, initial encounter: Secondary | ICD-10-CM | POA: Diagnosis not present

## 2023-12-04 DIAGNOSIS — W260XXA Contact with knife, initial encounter: Secondary | ICD-10-CM | POA: Insufficient documentation

## 2023-12-04 DIAGNOSIS — Y93G1 Activity, food preparation and clean up: Secondary | ICD-10-CM | POA: Diagnosis not present

## 2023-12-04 MED ORDER — TETANUS-DIPHTH-ACELL PERTUSSIS 5-2.5-18.5 LF-MCG/0.5 IM SUSY
0.5000 mL | PREFILLED_SYRINGE | Freq: Once | INTRAMUSCULAR | Status: AC
  Administered 2023-12-04: 0.5 mL via INTRAMUSCULAR
  Filled 2023-12-04: qty 0.5

## 2023-12-04 NOTE — ED Triage Notes (Signed)
 Pt was washing dishes tonight and right ring cut finger on pink knife. Pt has compress on finger. Not much bleeding.

## 2023-12-04 NOTE — ED Provider Notes (Signed)
 [place] EMERGENCY DEPARTMENT AT [facility] Provider Note   CSN: [id number] Arrival date & time: 12/04/23  0139     History  No chief complaint on file.   Carla [name] is a 18 y.o. female.  R ring finger volar surface superficial laceration, hemostatic, from a clean knife while doing dishes. No other injuries. Unk TDAP.         Home Medications Prior to Admission medications   Medication Sig Start Date End Date Taking? Authorizing Provider  ACCU-CHEK FASTCLIX LANCETS MISC 1 each 4 (four) times daily - after meals and at bedtime by Does not apply route. Check sugar 6 x daily 07/08/17   Gretchen Short, NP  ACCU-CHEK GUIDE test strip USE ONE STRIP TO CHECK GLUCOSE SIX TIMES DAILY 02/08/18   Gretchen Short, NP  albuterol (PROVENTIL) (2.5 MG/3ML) 0.083% nebulizer solution Take 3 mLs (2.5 mg total) by nebulization every 6 (six) hours as needed for wheezing or shortness of breath. 08/01/22   Valentino Nose, NP  albuterol (VENTOLIN HFA) 108 (90 Base) MCG/ACT inhaler Inhale 1-2 puffs into the lungs every 6 (six) hours as needed for wheezing or shortness of breath. 08/01/22   Valentino Nose, NP  Blood Glucose Monitoring Suppl (ACCU-CHEK GUIDE) w/Device KIT 1 kit by Does not apply route daily as needed (use to chcke Blood sugar 6x day). 12/02/18   Gretchen Short, NP  budesonide-formoterol (SYMBICORT) 160-4.5 MCG/ACT inhaler Inhale 2 puffs into the lungs 2 (two) times daily. Swish and spit after each use 11/04/22   Rodriguez-Southworth, Nettie Elm, PA-C  cetirizine (ZYRTEC ALLERGY) 10 MG tablet Take 1 tablet (10 mg total) by mouth daily. 06/27/21   Wallis Bamberg, PA-C  chlorhexidine (HIBICLENS) 4 % external liquid Apply topically daily as needed. 03/26/23   Particia Nearing, PA-C  dicyclomine (BENTYL) 20 MG tablet Take 1 tablet (20 mg total) by mouth 2 (two) times daily. 10/30/23   Kommor, Madison, MD  Dulaglutide (TRULICITY [place]) Inject into the skin.    [provider]  Norethindrone-Ethinyl Estradiol-Fe Biphas (LO LOESTRIN FE) 1 MG-10 MCG / 10 MCG tablet Take 1 tablet by mouth daily. Take 1 daily by mouth 09/04/22 11/02/23  Myna Hidalgo, DO  ondansetron (ZOFRAN-ODT) 4 MG disintegrating tablet Take 1 tablet (4 mg total) by mouth every 8 (eight) hours as needed. 08/15/23   Leath-Warren, Sadie Haber, NP  ondansetron (ZOFRAN-ODT) 4 MG disintegrating tablet Take 1 tablet (4 mg total) by mouth every 8 (eight) hours as needed for nausea or vomiting. 10/08/23   Particia Nearing, PA-C  promethazine-dextromethorphan (PROMETHAZINE-DM) 6.25-15 MG/5ML syrup Take 5 mLs by mouth 4 (four) times daily as needed. 07/01/23   Particia Nearing, PA-C      Allergies    Patient has no known allergies.    Review of Systems   Review of Systems  Physical Exam Updated Vital Signs BP 137/69   Pulse 79   Temp 98.4 F (36.9 C) (Oral)   Resp 18   SpO2 100%  Physical Exam Vitals and nursing note reviewed.  Constitutional:      Appearance: She is well-developed.  HENT:     Head: Normocephalic and atraumatic.     Mouth/Throat:     Mouth: Mucous membranes are moist.  Cardiovascular:     Rate and Rhythm: Normal rate and regular rhythm.  Pulmonary:     Effort: No respiratory distress.     Breath sounds: No stridor.  Abdominal:     General:  There is no distension.  Musculoskeletal:     Cervical back: Normal range of motion.  Skin:    Comments: R ring finger volar surface 1.5cm superficial laceration, hemostatic, NVI distally, no e/o tendinous injury  Neurological:     Mental Status: She is alert.     ED Results / Procedures / Treatments   Labs (all labs ordered are listed, but only abnormal results are displayed) Labs Reviewed - No data to display  EKG None  Radiology No results found.  Procedures .Laceration Repair  Date/Time: 12/04/2023 4:21 AM  Performed by: Marily Memos, MD Authorized by: Marily Memos, MD   Consent:    Consent obtained:   Verbal   Consent given by:  Patient   Risks, benefits, and alternatives were discussed: yes     Risks discussed:  Infection, need for additional repair, nerve damage, poor wound healing, poor cosmetic result, pain and retained foreign body   Alternatives discussed:  No treatment, delayed treatment, observation and referral Universal protocol:    Procedure explained and questions answered to patient or proxy's satisfaction: yes     Immediately prior to procedure, a time out was called: yes     Patient identity confirmed:  Verbally with patient and arm band Laceration details:    Location:  Finger   Finger location:  R ring finger   Length (cm):  1.5   Depth (mm):  3 Pre-procedure details:    Preparation:  Patient was prepped and draped in usual sterile fashion Exploration:    Imaging outcome: foreign body not noted     Wound exploration: wound explored through full range of motion and entire depth of wound visualized     Contaminated: no   Treatment:    Area cleansed with:  Shur-Clens and soap and water   Amount of cleaning:  Standard   Irrigation solution:  Sterile water   Irrigation method:  Syringe Skin repair:    Repair method:  Tissue adhesive Approximation:    Approximation:  Close Repair type:    Repair type:  Simple Post-procedure details:    Dressing:  Tube gauze and splint for protection   Procedure completion:  Tolerated well, no immediate complications     Medications Ordered in ED Medications  Tdap (BOOSTRIX) injection 0.5 mL (0.5 mLs Intramuscular Given 12/04/23 0415)    ED Course/ Medical Decision Making/ A&P                                 Medical Decision Making Risk Prescription drug management.   Wound repaired as above. Discussed wound  manaement with patient and expectations of tissue adhesive. TDAP updated. No indication for imaging.    Final Clinical Impression(s) / ED Diagnoses Final diagnoses:  Laceration of right ring finger without  foreign body, nail damage status unspecified, initial encounter    Rx / DC Orders ED Discharge Orders     None         Hymen Arnett, Barbara Cower, MD 12/04/23 [phone]

## 2023-12-23 ENCOUNTER — Other Ambulatory Visit: Payer: Self-pay | Admitting: Nurse Practitioner

## 2023-12-25 ENCOUNTER — Other Ambulatory Visit: Payer: Self-pay | Admitting: Nurse Practitioner

## 2023-12-25 ENCOUNTER — Encounter: Payer: Self-pay | Admitting: Emergency Medicine

## 2023-12-25 ENCOUNTER — Ambulatory Visit
Admission: EM | Admit: 2023-12-25 | Discharge: 2023-12-25 | Disposition: A | Discharge status: Home / Self Care | Attending: Family Medicine | Admitting: Family Medicine

## 2023-12-25 DIAGNOSIS — J069 Acute upper respiratory infection, unspecified: Secondary | ICD-10-CM

## 2023-12-25 DIAGNOSIS — R112 Nausea with vomiting, unspecified: Secondary | ICD-10-CM

## 2023-12-25 LAB — POC COVID19/FLU A&B COMBO
Covid Antigen, POC: NEGATIVE
Influenza A Antigen, POC: NEGATIVE
Influenza B Antigen, POC: NEGATIVE

## 2023-12-25 MED ORDER — ONDANSETRON 4 MG PO TBDP
4.0000 mg | ORAL_TABLET | Freq: Three times a day (TID) | ORAL | 0 refills | Status: AC | PRN
Start: 2023-12-25 — End: ?

## 2023-12-25 MED ORDER — PROMETHAZINE-DM 6.25-15 MG/5ML PO SYRP: 5.0000 mL | ORAL_SOLUTION | Freq: Four times a day (QID) | ORAL | 0 refills | Status: AC | PRN

## 2023-12-25 NOTE — ED Triage Notes (Signed)
 Vomiting, sore throat, back aches, nasal congestion x 2 days.  States she was exposed to flu at work.

## 2023-12-25 NOTE — ED Provider Notes (Signed)
 [facility]    CSN: [id number] Arrival date & time: 12/25/23  1408      History   Chief Complaint No chief complaint on file.   HPI Carla [name] is a 18 y.o. female.   Patient presenting today with several day history of congestion, cough, body aches, nausea, vomiting, sore throat.  Denies fever, chest pain, shortness of breath, abdominal pain, diarrhea, rashes.  So far trying TheraFlu, Mucinex with minimal relief.  Multiple sick contacts recently.  History of asthma on Symbicort  and as needed albuterol .    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) 02/12/2018   Type 2 diabetes mellitus with hyperglycemia, with long-term current use of insulin  (HCC) 10/22/2016   Essential hypertension 10/22/2016   Inadequate parental supervision and control 09/23/2016   Noncompliance with diabetes treatment 09/23/2016   Maladaptive health behaviors affecting medical condition 07/16/2016   Hyperglycemia 05/04/2016   Abscess, gluteal cleft 05/21/2015   Folliculitis of perineum 05/21/2015   Acanthosis nigricans     Past Surgical History:  Procedure Laterality Date   INCISION AND DRAINAGE PERIRECTAL ABSCESS N/A 05/24/2015   Procedure: IRRIGATION AND DEBRIDEMENT PERIRECTAL ABSCESS PEDIATRIC;  Surgeon: Alanda Allegra, MD;  Location: [facility] OR;  Service: Pediatrics;  Laterality: N/A;    OB History      Gravida  0   Para  0   Term  0   Preterm  0   AB  0   Living  0      SAB  0   IAB  0   Ectopic  0   Multiple  0   Live Births  0            Home Medications    Prior to Admission medications   Medication Sig Start Date End Date Taking? Authorizing Provider  ondansetron  (ZOFRAN -ODT) 4 MG disintegrating tablet Take 1 tablet (4 mg total) by mouth every 8 (eight) hours as needed for nausea or vomiting. 12/25/23  Yes Corbin Dess, PA-C  promethazine -dextromethorphan  (PROMETHAZINE -DM) 6.25-15 MG/5ML syrup Take 5 mLs by mouth 4 (four) times daily as needed. 12/25/23  Yes Corbin Dess, PA-C  ACCU-CHEK FASTCLIX LANCETS MISC 1 each 4 (four) times daily - after meals and at bedtime by Does not apply route. Check sugar 6 x daily 07/08/17   Candee Cha, NP  ACCU-CHEK GUIDE test strip USE ONE STRIP TO CHECK GLUCOSE SIX TIMES DAILY 02/08/18   Candee Cha, NP  albuterol  (PROVENTIL ) (2.5 MG/3ML) 0.083% nebulizer solution Take 3 mLs (2.5 mg total) by nebulization every 6 (six) hours as needed for wheezing or shortness of breath. 08/01/22   Wilhemena Harbour, NP  albuterol  (VENTOLIN  HFA) 108 (90 Base) MCG/ACT inhaler Inhale 1-2 puffs into the lungs every 6 (six) hours as needed for wheezing or shortness of breath. 08/01/22   Wilhemena Harbour, NP  Blood Glucose Monitoring Suppl (ACCU-CHEK GUIDE)  w/Device KIT 1 kit by Does not apply route daily as needed (use to chcke Blood sugar 6x day). 12/02/18   Candee Cha, NP  budesonide -formoterol  (SYMBICORT ) 160-4.5 MCG/ACT inhaler Inhale 2 puffs into the lungs 2 (two) times daily. Swish and spit after each use 11/04/22   Rodriguez-Southworth, Lamond Pilot, PA-C  cetirizine  (ZYRTEC  ALLERGY) 10 MG tablet Take 1 tablet (10 mg total) by mouth daily. 06/27/21   Adolph Hoop, PA-C  chlorhexidine  (HIBICLENS ) 4 % external liquid Apply topically daily as needed. 03/26/23   Corbin Dess, PA-C  dicyclomine  (BENTYL ) 20 MG tablet Take  1 tablet (20 mg total) by mouth 2 (two) times daily. 10/30/23   Kommor, Madison, MD  Dulaglutide (TRULICITY [place]) Inject into the skin.    [provider]  Norethindrone-Ethinyl Estradiol-Fe Biphas (LO LOESTRIN FE ) 1 MG-10 MCG / 10 MCG tablet Take 1 tablet by mouth daily. Take 1 daily by mouth 09/04/22 11/02/23  Ozan, Jennifer, DO  ondansetron  (ZOFRAN -ODT) 4 MG disintegrating tablet Take 1 tablet (4 mg total) by mouth every 8 (eight) hours as needed. 08/15/23   Leath-Warren, Belen Bowers, NP  ondansetron  (ZOFRAN -ODT) 4 MG disintegrating tablet Take 1 tablet (4 mg total) by mouth every 8 (eight) hours as needed for nausea or vomiting. 10/08/23   Corbin Dess, PA-C    Family History Family History  Problem Relation Age of Onset   Diabetes Paternal Grandmother    Breast cancer Maternal Grandmother    Diabetes Maternal Grandfather     Social History Social History   Tobacco Use   Smoking status: Never   Smokeless tobacco: Never  Vaping Use   Vaping status: Never Used  Substance Use Topics   Alcohol use: Never   Drug use: Never     Allergies   Patient has no known allergies.   Review of Systems Review of Systems Per HPI  Physical Exam Triage Vital Signs ED Triage Vitals  Encounter Vitals Group     BP 12/25/23 1416 138/80     Systolic BP Percentile --      Diastolic BP Percentile --      Pulse Rate 12/25/23 1416 99     Resp 12/25/23 1416 18     Temp 12/25/23 1416 98.2 F (36.8 C)     Temp Source 12/25/23 1416 Oral     SpO2 12/25/23 1416 94 %     Weight --      Height --      Head Circumference --      Peak Flow --      Pain Score 12/25/23 1417 7     Pain Loc --      Pain Education --      Exclude from Growth Chart --    No data found.  Updated Vital Signs BP 138/80 (BP Location: Right Arm)   Pulse 99   Temp 98.2 F (36.8 C) (Oral)   Resp 18   LMP 12/21/2023 (Exact Date)   SpO2 94%   Visual Acuity Right Eye Distance:   Left Eye Distance:    Bilateral Distance:    Right Eye Near:   Left Eye Near:    Bilateral Near:     Physical Exam Vitals and nursing note reviewed.  Constitutional:      Appearance: Normal appearance.  HENT:     Head: Atraumatic.     Right Ear: Tympanic membrane and external ear normal.     Left Ear: Tympanic membrane and external ear normal.  Nose: Rhinorrhea present.     Mouth/Throat:     Mouth: Mucous membranes are moist.     Pharynx: Posterior oropharyngeal erythema present.  Eyes:     Extraocular Movements: Extraocular movements intact.     Conjunctiva/sclera: Conjunctivae normal.  Cardiovascular:     Rate and Rhythm: Normal rate and regular rhythm.     Heart sounds: Normal heart sounds.  Pulmonary:     Effort: Pulmonary effort is normal.     Breath sounds: Normal breath sounds. No wheezing or rales.  Musculoskeletal:        General: Normal range of motion.     Cervical back: Normal range of motion and neck supple.  Skin:    General: Skin is warm and dry.  Neurological:     Mental Status: She is alert and oriented to person, place, and time.  Psychiatric:        Mood and Affect: Mood normal.        Thought Content: Thought content normal.      UC Treatments / Results  Labs (all labs ordered are listed, but only abnormal results are displayed) Labs Reviewed  POC COVID19/FLU A&B COMBO - Normal    EKG   Radiology No results found.  Procedures Procedures (including critical care time)  Medications Ordered in UC Medications - No data to display  Initial Impression / Assessment and Plan / UC Course  I have reviewed the triage vital signs and the nursing notes.  Pertinent labs & imaging results that were available during my care of the patient were reviewed by me and considered in my medical decision making (see chart for details).     Vitals and exam reassuring today, suspect viral upper respiratory infection.  Rapid flu and COVID-negative.  Treat with Phenergan  DM,  Zofran , supportive over-the-counter medications and home care.  Work note given.  Return for worsening symptoms.  Final Clinical Impressions(s) / UC Diagnoses   Final diagnoses:  Viral URI with cough  Nausea and vomiting, unspecified vomiting type   Discharge Instructions   None    ED Prescriptions     Medication Sig Dispense Auth. Provider   ondansetron  (ZOFRAN -ODT) 4 MG disintegrating tablet Take 1 tablet (4 mg total) by mouth every 8 (eight) hours as needed for nausea or vomiting. 20 tablet Corbin Dess, PA-C   promethazine -dextromethorphan  (PROMETHAZINE -DM) 6.25-15 MG/5ML syrup Take 5 mLs by mouth 4 (four) times daily as needed. 100 mL Corbin Dess, [place]      PDMP not reviewed this encounter.   Corbin Dess, [place] 12/25/23 1505

## 2024-02-02 ENCOUNTER — Emergency Department (HOSPITAL_COMMUNITY)

## 2024-02-02 ENCOUNTER — Emergency Department (HOSPITAL_COMMUNITY)
Admission: EM | Admit: 2024-02-02 | Discharge: 2024-02-02 | Disposition: A | Discharge status: Home / Self Care | Attending: Emergency Medicine | Admitting: Emergency Medicine

## 2024-02-02 ENCOUNTER — Other Ambulatory Visit: Payer: Self-pay

## 2024-02-02 ENCOUNTER — Encounter (HOSPITAL_COMMUNITY): Payer: Self-pay | Admitting: Emergency Medicine

## 2024-02-02 DIAGNOSIS — J45901 Unspecified asthma with (acute) exacerbation: Secondary | ICD-10-CM | POA: Diagnosis not present

## 2024-02-02 LAB — CBC WITH DIFFERENTIAL/PLATELET
Abs Immature Granulocytes: 0.05 10*3/uL (ref 0.00–0.07)
Basophils Absolute: 0 10*3/uL (ref 0.0–0.1)
Basophils Relative: 0 %
Eosinophils Absolute: 0.2 10*3/uL (ref 0.0–0.5)
Eosinophils Relative: 2 %
HCT: 36.8 % (ref 36.0–46.0)
Hemoglobin: 11.8 g/dL — ABNORMAL LOW (ref 12.0–15.0)
Immature Granulocytes: 1 %
Lymphocytes Relative: 28 %
Lymphs Abs: 3 10*3/uL (ref 0.7–4.0)
MCH: 28.1 pg (ref 26.0–34.0)
MCHC: 32.1 g/dL (ref 30.0–36.0)
MCV: 87.6 fL (ref 80.0–100.0)
Monocytes Absolute: 0.7 10*3/uL (ref 0.1–1.0)
Monocytes Relative: 6 %
Neutro Abs: 6.9 10*3/uL (ref 1.7–7.7)
Neutrophils Relative %: 63 %
Platelets: 239 10*3/uL (ref 150–400)
RBC: 4.2 MIL/uL (ref 3.87–5.11)
RDW: 14.6 % (ref 11.5–15.5)
WBC: 10.9 10*3/uL — ABNORMAL HIGH (ref 4.0–10.5)
nRBC: 0 % (ref 0.0–0.2)

## 2024-02-02 LAB — BASIC METABOLIC PANEL WITH GFR
Anion gap: 10 (ref 5–15)
BUN: 10 mg/dL (ref 6–20)
CO2: 21 mmol/L — ABNORMAL LOW (ref 22–32)
Calcium: 9.3 mg/dL (ref 8.9–10.3)
Chloride: 106 mmol/L (ref 98–111)
Creatinine, Ser: 0.69 mg/dL (ref 0.44–1.00)
GFR, Estimated: >60 mL/min (ref >60)
Glucose, Bld: 109 mg/dL — ABNORMAL HIGH (ref 70–99)
Potassium: 3.2 mmol/L — ABNORMAL LOW (ref 3.5–5.1)
Sodium: 137 mmol/L (ref 135–145)

## 2024-02-02 LAB — HCG, SERUM, QUALITATIVE: Preg, Serum: NEGATIVE

## 2024-02-02 LAB — D-DIMER, QUANTITATIVE: D-Dimer, Quant: <0.27 ug{FEU}/mL (ref 0.00–0.50)

## 2024-02-02 LAB — RESP PANEL BY RT-PCR (RSV, FLU A&B, COVID)  RVPGX2
Influenza A by PCR: NEGATIVE
Influenza B by PCR: NEGATIVE
Resp Syncytial Virus by PCR: NEGATIVE
SARS Coronavirus 2 by RT PCR: NEGATIVE

## 2024-02-02 MED ORDER — MAGNESIUM SULFATE 2 GM/50ML IV SOLN
2.0000 g | Freq: Once | INTRAVENOUS | Status: AC
  Administered 2024-02-02: 2 g via INTRAVENOUS
  Filled 2024-02-02: qty 50

## 2024-02-02 MED ORDER — ALBUTEROL SULFATE HFA 108 (90 BASE) MCG/ACT IN AERS: 1.0000 | INHALATION_SPRAY | Freq: Four times a day (QID) | RESPIRATORY_TRACT | 0 refills | Status: AC | PRN

## 2024-02-02 MED ORDER — IPRATROPIUM BROMIDE 0.02 % IN SOLN: 0.5000 mg | Freq: Once | RESPIRATORY_TRACT | Status: DC

## 2024-02-02 MED ORDER — POTASSIUM CHLORIDE CRYS ER 20 MEQ PO TBCR
40.0000 meq | EXTENDED_RELEASE_TABLET | Freq: Once | ORAL | Status: AC
  Administered 2024-02-02: 40 meq via ORAL
  Filled 2024-02-02: qty 2

## 2024-02-02 MED ORDER — PREDNISONE 20 MG PO TABS
40.0000 mg | ORAL_TABLET | Freq: Every day | ORAL | 0 refills | Status: AC
Start: 2024-02-02 — End: 2024-02-06

## 2024-02-02 MED ORDER — ALBUTEROL SULFATE (2.5 MG/3ML) 0.083% IN NEBU: 5.0000 mg | INHALATION_SOLUTION | Freq: Once | RESPIRATORY_TRACT | Status: DC

## 2024-02-02 MED ORDER — ALBUTEROL SULFATE (2.5 MG/3ML) 0.083% IN NEBU
2.5000 mg | INHALATION_SOLUTION | Freq: Once | RESPIRATORY_TRACT | Status: AC
  Administered 2024-02-02: 2.5 mg via RESPIRATORY_TRACT
  Filled 2024-02-02: qty 3

## 2024-02-02 MED ORDER — ALBUTEROL SULFATE (2.5 MG/3ML) 0.083% IN NEBU: 2.5000 mg | INHALATION_SOLUTION | Freq: Four times a day (QID) | RESPIRATORY_TRACT | 0 refills | Status: AC | PRN

## 2024-02-02 MED ORDER — IPRATROPIUM-ALBUTEROL 0.5-2.5 (3) MG/3ML IN SOLN
3.0000 mL | Freq: Once | RESPIRATORY_TRACT | Status: AC
  Administered 2024-02-02: 3 mL via RESPIRATORY_TRACT
  Filled 2024-02-02: qty 3

## 2024-02-02 NOTE — ED Notes (Signed)
 X-ray at bedside

## 2024-02-02 NOTE — ED Provider Notes (Signed)
 [place] EMERGENCY DEPARTMENT AT [facility] Provider Note   CSN: [id number] Arrival date & time: 02/02/24  1528     History {Add pertinent medical, surgical, social history, OB history to HPI:1} Chief Complaint  Patient presents with   Shortness of Breath   Asthma    Carla [name] is a 18 y.o. female.  18 year old female history of asthma who presents emergency department with shortness of breath.  Patient reports that yesterday started having some shortness of breath.  Says that she got to work and started walking around but it got worse.  Also had some chest tightness that is typical of her asthma exacerbations.  Did have a URI last week.  Took 2 doses of her prednisone  at home as well as doses of her inhaler but reports that her symptoms persisted so she came into the emergency department for evaluation.  No history of intubations for asthma.  Is on OCPs.       Home Medications Prior to Admission medications   Medication Sig Start Date End Date Taking? Authorizing Provider  ACCU-CHEK FASTCLIX LANCETS MISC 1 each 4 (four) times daily - after meals and at bedtime by Does not apply route. Check sugar 6 x daily 07/08/17   Candee Cha, NP  ACCU-CHEK GUIDE test strip USE ONE STRIP TO CHECK GLUCOSE SIX TIMES DAILY 02/08/18   Candee Cha, NP  albuterol  (PROVENTIL ) (2.5 MG/3ML) 0.083% nebulizer solution Take 3 mLs (2.5 mg total) by nebulization every 6 (six) hours as needed for wheezing or shortness of breath. 08/01/22   Wilhemena Harbour, NP  albuterol  (VENTOLIN  HFA) 108 (90 Base) MCG/ACT inhaler Inhale 1-2 puffs into the lungs every 6 (six) hours as needed for wheezing or shortness of breath. 08/01/22   Wilhemena Harbour, NP  Blood Glucose Monitoring Suppl (ACCU-CHEK GUIDE) w/Device KIT 1 kit by Does not apply route daily as needed (use to chcke Blood sugar 6x day). 12/02/18   Candee Cha, NP  budesonide -formoterol  (SYMBICORT ) 160-4.5 MCG/ACT inhaler Inhale 2  puffs into the lungs 2 (two) times daily. Swish and spit after each use 11/04/22   Rodriguez-Southworth, Lamond Pilot, PA-C  cetirizine  (ZYRTEC  ALLERGY) 10 MG tablet Take 1 tablet (10 mg total) by mouth daily. 06/27/21   Adolph Hoop, PA-C  chlorhexidine  (HIBICLENS ) 4 % external liquid Apply topically daily as needed. 03/26/23   Corbin Dess, PA-C  dicyclomine  (BENTYL ) 20 MG tablet Take 1 tablet (20 mg total) by mouth 2 (two) times daily. 10/30/23   Kommor, Madison, MD  Dulaglutide (TRULICITY [place]) Inject into the skin.    [provider]  Norethindrone-Ethinyl Estradiol-Fe Biphas (LO LOESTRIN FE ) 1 MG-10 MCG / 10 MCG tablet Take 1 tablet by mouth daily. Take 1 daily by mouth 09/04/22 11/02/23  Ozan, Jennifer, DO  ondansetron  (ZOFRAN -ODT) 4 MG disintegrating tablet Take 1 tablet (4 mg total) by mouth every 8 (eight) hours as needed. 08/15/23   Leath-Warren, Belen Bowers, NP  ondansetron  (ZOFRAN -ODT) 4 MG disintegrating tablet Take 1 tablet (4 mg total) by mouth every 8 (eight) hours as needed for nausea or vomiting. 10/08/23   Corbin Dess, PA-C  ondansetron  (ZOFRAN -ODT) 4 MG disintegrating tablet Take 1 tablet (4 mg total) by mouth every 8 (eight) hours as needed for nausea or vomiting. 12/25/23   Corbin Dess, PA-C  promethazine -dextromethorphan  (PROMETHAZINE -DM) 6.25-15 MG/5ML syrup Take 5 mLs by mouth 4 (four) times daily as needed. 12/25/23   Corbin Dess, PA-C  Allergies    Patient has no known allergies.    Review of Systems   Review of Systems  Physical Exam Updated Vital Signs SpO2 100%  Physical Exam Vitals and nursing note reviewed.  Constitutional:      General: She is not in acute distress.    Appearance: She is well-developed.  HENT:     Head: Normocephalic and atraumatic.     Right Ear: External ear normal.     Left Ear: External ear normal.     Nose: Nose normal.  Eyes:     Extraocular Movements: Extraocular movements intact.      Conjunctiva/sclera: Conjunctivae normal.     Pupils: Pupils are equal, round, and reactive to light.  Cardiovascular:     Rate and Rhythm: Normal rate and regular rhythm.     Heart sounds: No murmur heard. Pulmonary:     Effort: Pulmonary effort is normal. No respiratory distress.     Breath sounds: Wheezing present.     Comments: Speaking in full sentences  Musculoskeletal:     Cervical back: Normal range of motion and neck supple.     Right lower leg: No edema.     Left lower leg: No edema.  Skin:    General: Skin is warm and dry.  Neurological:     Mental Status: She is alert and oriented to person, place, and time. Mental status is at baseline.  Psychiatric:        Mood and Affect: Mood normal.     ED Results / Procedures / Treatments   Labs (all labs ordered are listed, but only abnormal results are displayed) Labs Reviewed - No data to display  EKG None  Radiology No results found.  Procedures Procedures  {Document cardiac monitor, telemetry assessment procedure when appropriate:1}  Medications Ordered in ED Medications - No data to display  ED Course/ Medical Decision Making/ A&P   {   Click here for ABCD2, HEART and other calculatorsREFRESH Note before signing :1}                              Medical Decision Making Amount and/or Complexity of Data Reviewed Labs: ordered. Radiology: ordered.  Risk Prescription drug management.   ***  {Document critical care time when appropriate:1} {Document review of labs and clinical decision tools ie heart score, Chads2Vasc2 etc:1}  {Document your independent review of radiology images, and any outside records:1} {Document your discussion with family members, caretakers, and with consultants:1} {Document social determinants of health affecting pt's care:1} {Document your decision making why or why not admission, treatments were needed:1} Final Clinical Impression(s) / ED Diagnoses Final diagnoses:  None     Rx / DC Orders ED Discharge Orders     None

## 2024-02-02 NOTE — ED Triage Notes (Signed)
 Pt arrives to triage via EMS.  Per EMS, pt reports that yesterday she started to feel short of breath. Pt reports that today she was walking around and she started to feel short of breath and chest tightness. Pt reports this feels similar to her asthma attacks. Pt is on prednisone  and has received 5mg  of albuterol .

## 2024-02-02 NOTE — Discharge Instructions (Signed)
 Follow-up with your primary doctor in 2 to 3 days regarding your asthma exacerbation
